# Patient Record
Sex: Male | Born: 1985 | Race: White | Hispanic: No | Marital: Single | Smoking: Current every day smoker
Health system: Southern US, Community
[De-identification: ages and names within clinical notes are randomized; demographics above are authoritative.]

## PROBLEM LIST (undated history)

## (undated) DIAGNOSIS — J45909 Unspecified asthma, uncomplicated: Secondary | ICD-10-CM

## (undated) DIAGNOSIS — E079 Disorder of thyroid, unspecified: Secondary | ICD-10-CM

## (undated) HISTORY — PX: THYROIDECTOMY: SHX17

---

## 2010-02-06 ENCOUNTER — Emergency Department (HOSPITAL_COMMUNITY): Admission: EM | Admit: 2010-02-06 | Discharge: 2010-02-07 | Payer: Self-pay | Admitting: Emergency Medicine

## 2010-12-07 ENCOUNTER — Emergency Department (HOSPITAL_COMMUNITY)
Admission: EM | Admit: 2010-12-07 | Discharge: 2010-12-07 | Disposition: A | Discharge status: Home / Self Care | Payer: Self-pay | Attending: Emergency Medicine | Admitting: Emergency Medicine

## 2010-12-07 DIAGNOSIS — IMO0002 Reserved for concepts with insufficient information to code with codable children: Secondary | ICD-10-CM | POA: Insufficient documentation

## 2010-12-07 DIAGNOSIS — J45909 Unspecified asthma, uncomplicated: Secondary | ICD-10-CM | POA: Insufficient documentation

## 2010-12-07 DIAGNOSIS — W540XXA Bitten by dog, initial encounter: Secondary | ICD-10-CM | POA: Insufficient documentation

## 2011-01-04 LAB — RAPID URINE DRUG SCREEN, HOSP PERFORMED
Cocaine: NOT DETECTED
Opiates: NOT DETECTED

## 2011-01-04 LAB — COMPREHENSIVE METABOLIC PANEL
ALT: 23 U/L (ref 0–53)
AST: 26 U/L (ref 0–37)
Albumin: 4.6 g/dL (ref 3.5–5.2)
Alkaline Phosphatase: 65 U/L (ref 39–117)
CO2: 29 mEq/L (ref 19–32)
GFR calc Af Amer: >60 mL/min (ref >60)
Total Bilirubin: 0.3 mg/dL (ref 0.3–1.2)
Total Protein: 7.7 g/dL (ref 6.0–8.3)

## 2011-01-04 LAB — POCT CARDIAC MARKERS: CKMB, poc: 1.1 ng/mL (ref 1.0–8.0)

## 2011-01-04 LAB — CBC
MCHC: 34.2 g/dL (ref 30.0–36.0)
Platelets: 204 10*3/uL (ref 150–400)
RBC: 4.48 MIL/uL (ref 4.22–5.81)
WBC: 9.6 10*3/uL (ref 4.0–10.5)

## 2011-01-04 LAB — DIFFERENTIAL
Basophils Absolute: 0.1 10*3/uL (ref 0.0–0.1)
Basophils Relative: 1 % (ref 0–1)
Eosinophils Absolute: 0.3 10*3/uL (ref 0.0–0.7)
Eosinophils Relative: 3 % (ref 0–5)
Lymphocytes Relative: 19 % (ref 12–46)
Monocytes Absolute: 1.4 10*3/uL — ABNORMAL HIGH (ref 0.1–1.0)
Monocytes Relative: 14 % — ABNORMAL HIGH (ref 3–12)

## 2011-09-20 IMAGING — CR DG CHEST 2V
2 series · 2 of 2 positions shown · non-contrast
Comparison: None.

CLINICAL DATA: Rapid heart rate, shortness of breath

CHEST - 2 VIEW

[w chest pa]
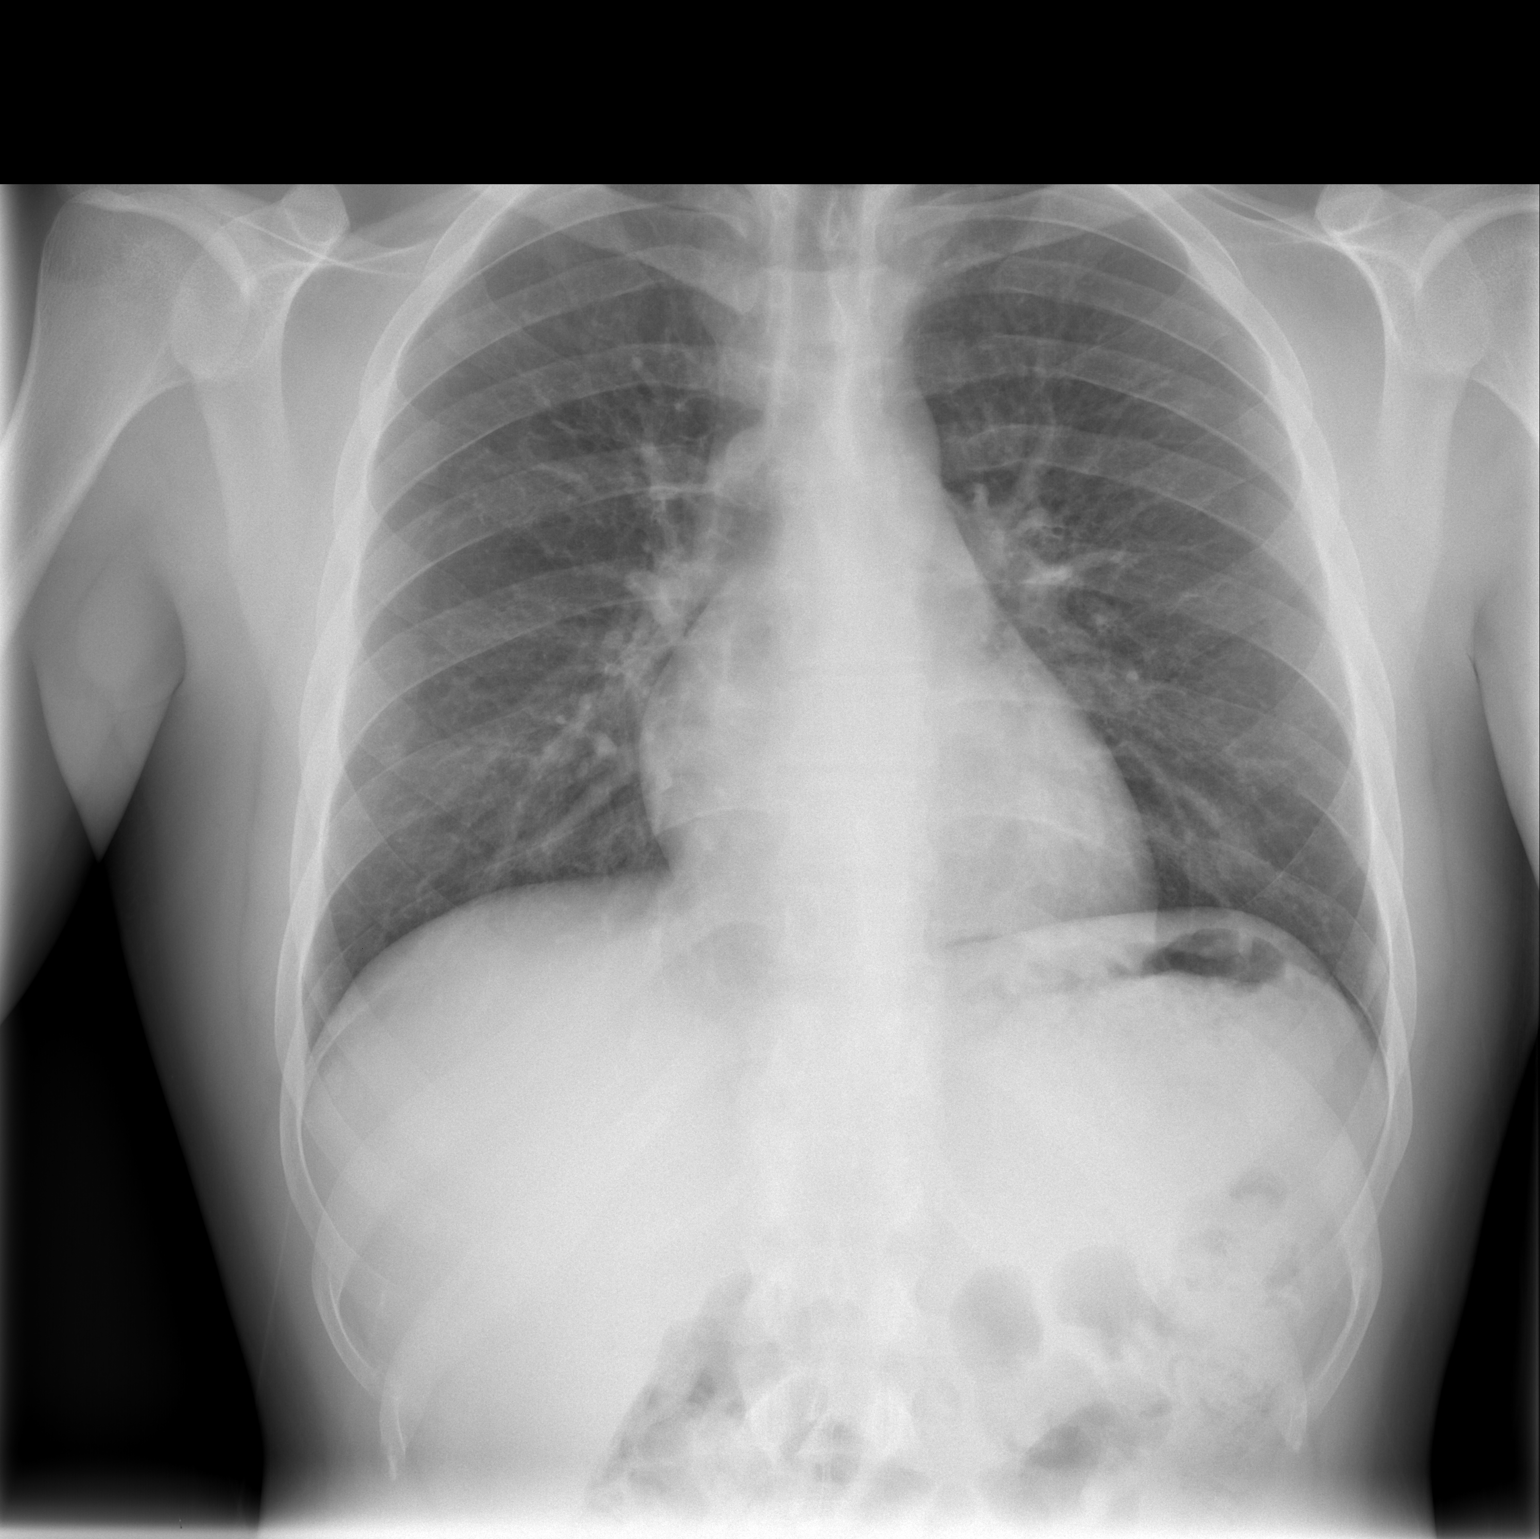

[w chest lat]
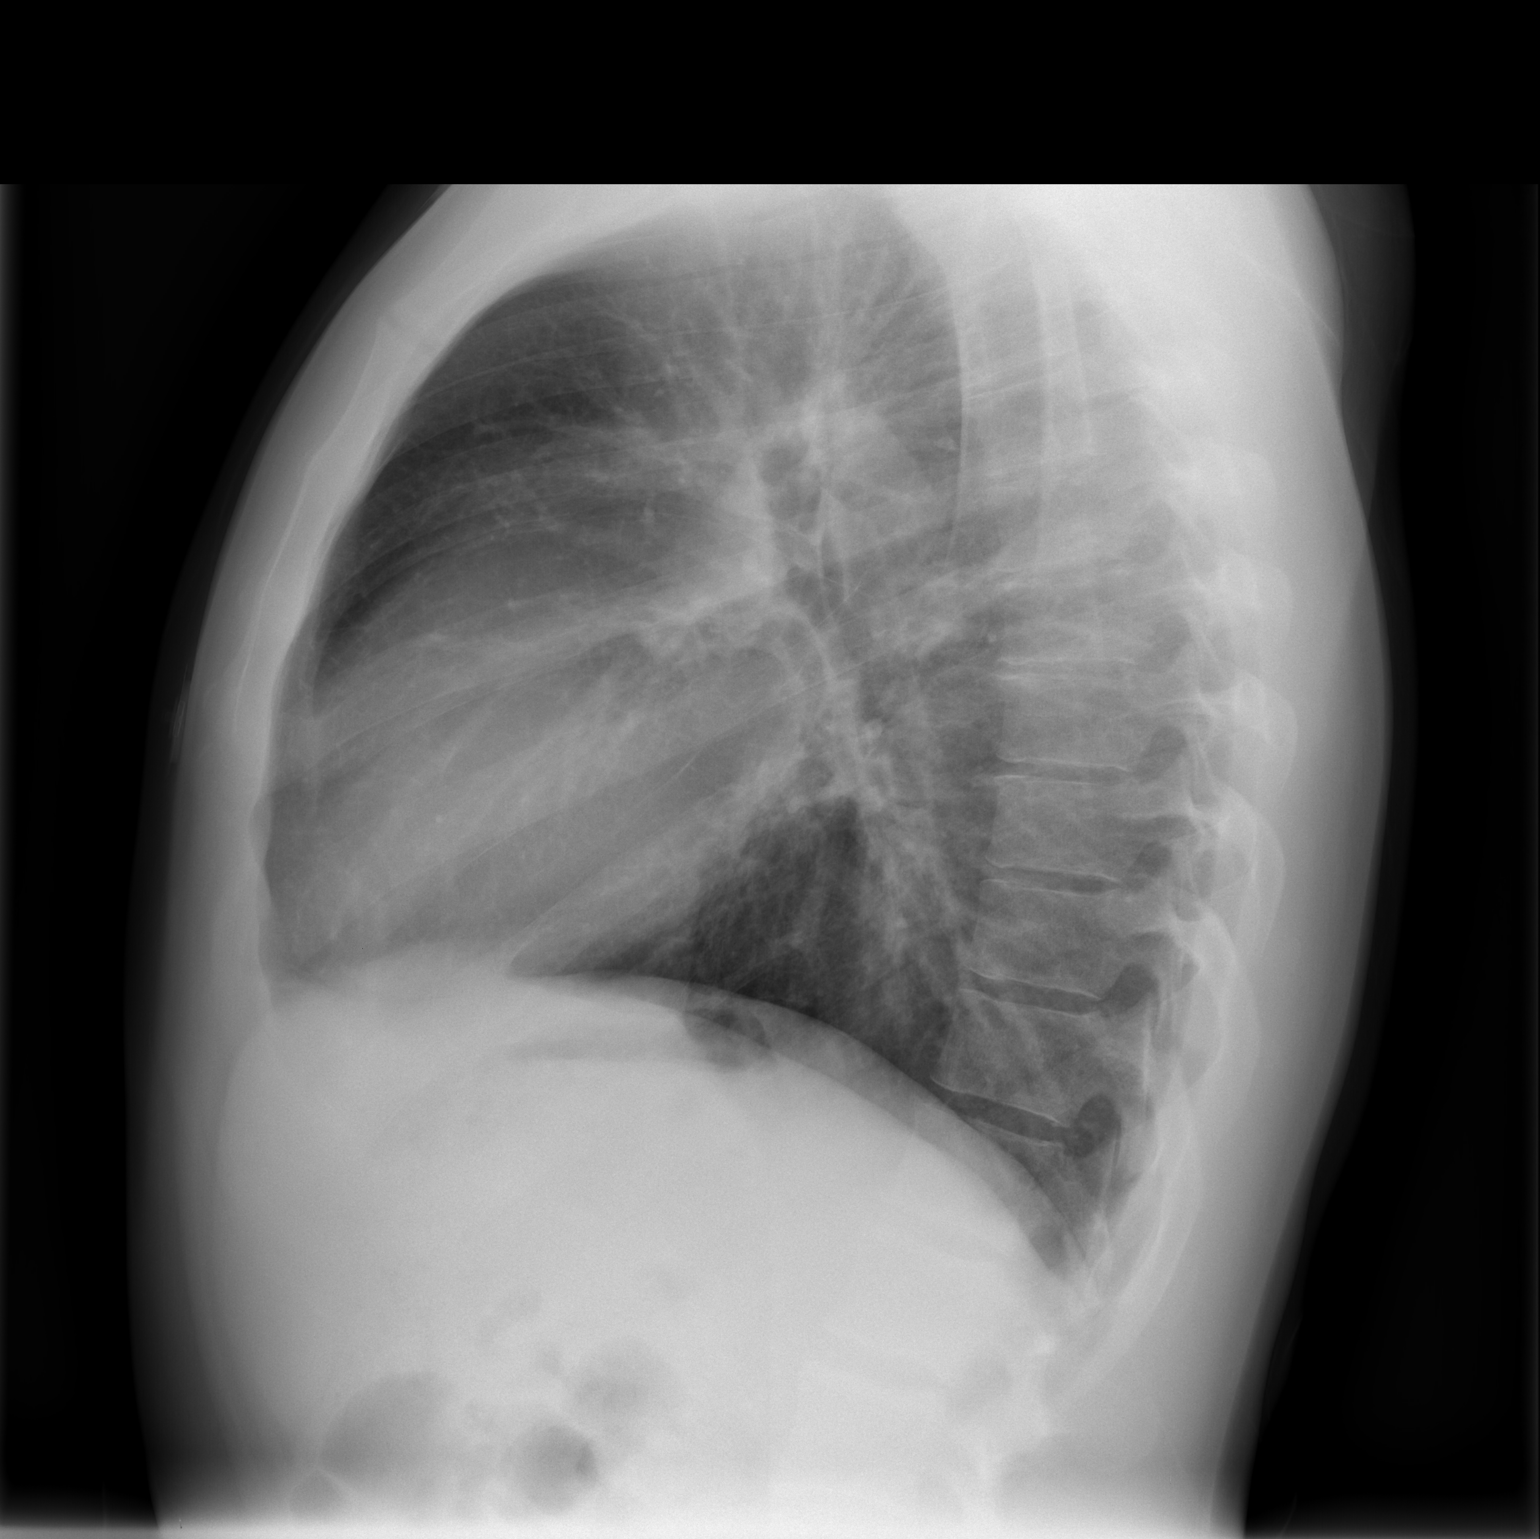

[2 of 2 positions shown; findings below may reference images not displayed]

FINDINGS: No active infiltrate or effusion is seen.  The lungs are
hyperaerated and there is some peribronchial thickening, findings
which can be seen with asthma or bronchitis.  The heart is within
normal limits in size.  No bony abnormality is seen.
IMPRESSION: No pneumonia.  Hyperaeration and peribronchial thickening may
indicate asthma or bronchitis.

## 2016-01-08 ENCOUNTER — Emergency Department (HOSPITAL_COMMUNITY)
Admission: EM | Admit: 2016-01-08 | Discharge: 2016-01-08 | Disposition: A | Discharge status: Home / Self Care | Payer: Self-pay | Attending: Emergency Medicine | Admitting: Emergency Medicine

## 2016-01-08 ENCOUNTER — Encounter (HOSPITAL_COMMUNITY): Payer: Self-pay | Admitting: Emergency Medicine

## 2016-01-08 DIAGNOSIS — Z79899 Other long term (current) drug therapy: Secondary | ICD-10-CM | POA: Insufficient documentation

## 2016-01-08 DIAGNOSIS — F329 Major depressive disorder, single episode, unspecified: Secondary | ICD-10-CM | POA: Insufficient documentation

## 2016-01-08 DIAGNOSIS — F32A Depression, unspecified: Secondary | ICD-10-CM

## 2016-01-08 NOTE — ED Notes (Signed)
Pt reports depression. Denies SI/HI or substance abuse, although he did tell someone he thought about hurting himself without meaning it. Depression has been worsened by losing job and having to live at home.

## 2016-01-08 NOTE — ED Provider Notes (Signed)
CSN: [id number]     Arrival date & time 01/08/16  1404 History   First MD Initiated Contact with Patient 01/08/16 1507     Chief Complaint  Patient presents with  . Depression     (Consider location/radiation/quality/duration/timing/severity/associated sxs/prior Treatment) HPI Comments: Patient here complaining of increased depression times several days. Denies any suicidal or homicidal ideations. No history of substance abuse. His issues are related to problems at home. Denies any prior history of depression. No prior history of suicide attempt. Does not have a primary care doctor and did not know where to go for help which is why he came to the emergency department. Denies any somatic complaints at this time. No self Medication used  Patient is a 30 y.o. male presenting with depression. The history is provided by the patient.  Depression    History reviewed. No pertinent past medical history. History reviewed. No pertinent past surgical history. History reviewed. No pertinent family history. Social History  Substance Use Topics  . Smoking status: Never Smoker   . Smokeless tobacco: None  . Alcohol Use: Yes     Comment: occasional    Review of Systems  Psychiatric/Behavioral: Positive for depression.  All other systems reviewed and are negative.     Allergies  Review of patient's allergies indicates no known allergies.  Home Medications   Prior to Admission medications   Medication Sig Start Date End Date Taking? Authorizing Provider  cetirizine (ZYRTEC) 10 MG tablet Take 10 mg by mouth daily.   Yes Historical Provider, MD   BP 133/95 mmHg  Pulse 99  Temp(Src) 98 F (36.7 C) (Oral)  Resp 16  SpO2 99% Physical Exam  Constitutional: He is oriented to person, place, and time. He appears well-developed and well-nourished.  Non-toxic appearance. No distress.  HENT:  Head: Normocephalic and atraumatic.  Eyes: Conjunctivae, EOM and lids are normal. Pupils are equal,  round, and reactive to light.  Neck: Normal range of motion. Neck supple. No tracheal deviation present. No thyroid mass present.  Cardiovascular: Normal rate, regular rhythm and normal heart sounds.  Exam reveals no gallop.   No murmur heard. Pulmonary/Chest: Effort normal and breath sounds normal. No stridor. No respiratory distress. He has no decreased breath sounds. He has no wheezes. He has no rhonchi. He has no rales.  Abdominal: Soft. Normal appearance and bowel sounds are normal. He exhibits no distension. There is no tenderness. There is no rebound and no CVA tenderness.  Musculoskeletal: Normal range of motion. He exhibits no edema or tenderness.  Neurological: He is alert and oriented to person, place, and time. He has normal strength. No cranial nerve deficit or sensory deficit. GCS eye subscore is 4. GCS verbal subscore is 5. GCS motor subscore is 6.  Skin: Skin is warm and dry. No abrasion and no rash noted.  Psychiatric: His speech is normal and behavior is normal. His affect is blunt. He expresses no suicidal plans and no homicidal plans.  Nursing note and vitals reviewed.   ED Course  Procedures (including critical care time) Labs Review Labs Reviewed - No data to display  Imaging Review No results found. I have personally reviewed and evaluated these images and lab results as part of my medical decision-making.   EKG Interpretation None      MDM   Final diagnoses:  Depression    Patient given outpatient resources and has no current evidence of active psychiatric emergency.    Lorre NickAnthony Treysean Petruzzi, MD 01/08/16 [phone]

## 2016-01-08 NOTE — Discharge Instructions (Signed)
Community Resource Guide Outpatient Counseling/Substance Abuse Adult °The [employer]’s “211” is a great source of information about community services available.  Access by dialing 2-1-1 from anywhere in [place], or by website -  www.nc211.org.  ° °Other Local Resources (Updated 10/2015) ° °Crisis Hotlines °  °Services  ° °  °Area Served  °[facility] • Crisis Hotline, available 24 hours a day, 7 days a week: 800-939-5911 [place], [place]  ° [facility] • Crisis Hotline, available 24 hours a day, 7 days a week: 866-275-9552 [place], [place]  °[facility] • Suicide Prevention Hotline, available 24 hours a day, 7 days a week: 800-273-8255 [place], [place]  °[facility] ° • Crisis Hotline, available 24 hours a day, 7 days a week: 336-676-6840 [place], [place] °  °[facility] • Crisis Hotline, available 24 hours a day, 7 days a week: 800-256-2452 All °  °[facility] • Crisis Hotline, available 24 hours a day, 7 days a week: 877-626-1772 [place]  ° °Other Local Resources (Updated 10/2015) ° °Outpatient Counseling/ Substance Abuse Programs  °Services  ° °  °Address and Phone Number  °[facility] ° • Options include Individual counseling, group counseling, intensive outpatient program (several hours a day, several days a week) °• Offers depression assessments °• Provides methadone maintenance program 336-333-6860 °[address] °[place], [place] [address] °  °[facility] ° • Offers partial hospitalization/day treatment and DUI/DWI programs °• Accepts Medicare, private insurance 336-299-4655 °[address] °[place], [place] [address]  °[facility] ° ° • Services include intensive outpatient program (several hours a day, several days a week), outpatient treatment, DUI/DWI services, family education °• Also has some services specifically for Veterans °• Offers transitional housing   336-886-5594 °[address] °[place], [place] [address] °  °  °[facility] • Accepts Medicare, private pay, and private insurance 336-272-0855 °[address] °[place], [place] [address]  °[facility] • Services include individual counseling, substance abuse intensive outpatient program (several hours a day, several days a week), day treatment °• Accepts Medicare, Medicaid, private insurance 336-271-5888 °[address] °[place], [place] [address]  °[facility] ° • Offers substance abuse intensive outpatient program (several hours a day, several days a week), partial hospitalization program 336-832-9800 °[address] °[place], [place] [address] ° °336-349-4454 °[address] °[place], [place] [address] ° °336-386-3795 °[address] °[place], [place] [address] ° °[phone] °[address] °[place], [place] [address]  °[facility] • Individual counseling only °• Accepts private insurance only 336-292-1510 °[address] °[place], [place] [address]  °[facility] • Methadone maintenance program 800-805-6989 °[address] °[place], [place] [address]  °[facility] • Walk-In Clinic providing substance abuse and mental health counseling °• Accepts Medicaid, Medicare, private insurance °• Offers sliding scale for uninsured 336-342-8316 °[address] °[place], [place]   °[facility] • Offers individual counseling, and intensive in-home services 336-347-7415 °[address] °[place], [place] [address]  °[facility] • Offers individual counseling, family counseling, group therapy, domestic violence counseling, consumer credit counseling °• Accepts Medicare, Medicaid, private insurance °• Offers sliding scale for uninsured 336-387-6161 °[address] °[place], [place] [address] ° °336-889-6161 °[place], [address] °[place],  [address]  °[facility] • Offers individual, family   and group counseling °• 3 locations - [place], [place], and [place] ° 336-899-8800 ° °[address] °[place], [place] [address] ° °[address] °[place], [place] [address] ° °[address] °[place], [place] [address]  °[facility]  ° • Offers psychiatric assessment, 8-week Intensive Outpatient Program (several hours a day, several times a week, daytime or evenings), early recovery group, family Program, medication management °• Private pay or private insurance only 336 -621-3381, or  °800-659-3381 °[address] °[place], [place] [address]  °[facility] • Offers individual, couples and family counseling °• Accepts Medicaid, private insurance, and sliding scale for uninsured 336-542-2076 °[address] °[place], [place] [address]  °David Fuller, [facility] • Individual counseling °• Private insurance 336-852-4051 °[address] °[place], [place] [address]  °[facility] ° • Offers assessment, substance abuse treatment, and behavioral health treatment 336-878-6098 °[address] °[place], [place] [address]  °[facility] • Individual counseling °• Accepts private insurance 336-272-1972 °[address] °[place], [place] [address]  °[facility] • Individual counseling °• Accepts Medicare, private insurance 336-547-1574 °[address] °[place], [place] [address]  °[facility]  ° • Offers intensive outpatient program (several hours a day, several times a week) °• Private pay, private insurance [phone] °[address] °[place], [place]  °[facility] • Individual counseling °• Medicare, private insurance 336-505-9494 °[address] °[place], [place] [address]  °[facility]  ° • Offers intensive outpatient program (several hours a day, several times a week) and partial hospitalization  program 336-794-3550 °[address] °[place], [place] [address]  °Parrish McKinney, MD • Individual counseling 336-282-1251 °[address] °[place], [place] [address]  °[facility] • Offers Christian counseling to individuals, couples, and families °• Accepts Medicare and private insurance; offers sliding scale for uninsured 336-288-1484 °[address] °[place], [place] [address]  °[facility] • Christian counseling 336-542-2060 °[address] °[place], [place] [address]  °[facility] ° • Offers crisis counseling, individual counseling, group therapy, in-home therapy, domestic violence services, day treatment, DWI services, Community Support Team (CST), Assertive Community Treatment Team (ACTT), substance abuse Intensive Outpatient Program (several hours a day, several times a week) °• 2 locations - [place] and [place] 336-229-5905 °[address] °[place], [place] [address] ° °336-694-1777 °[address] °[place], [place] [address]  °[facility]  ° ° • Individual counseling and group therapy °• Accepts private insurance, Medicare, Medicaid 336-379-7146 °[address] °[place], [place]  °[facility] • Offers individual and family counseling °• Offers LGBTQ services °• Accepts private insurance and private pay 336-288-9190 °[address] °[place], [place] [address]  °[facility]  ° • Offers individual counseling, group therapy, and outpatient detox °• Accepts private insurance 336-389-1413 °[address] °[place], [place]  °[facility] • Individual counseling °• Accepts Medicare, private insurance 336-632-3505 °[address] °[place], [place] [address]  °[facility] • Individual counseling °• Accepts Medicare, private insurance 336-570-0104 °[address] °[place],  [address]  °[facility] ° • Offers substance abuse  Intensive Outpatient Program (several hours a day, several times a week) [phone], or °[phone] °,   ° °

## 2016-04-14 ENCOUNTER — Encounter (HOSPITAL_COMMUNITY): Payer: Self-pay | Admitting: *Deleted

## 2016-04-14 ENCOUNTER — Emergency Department (HOSPITAL_COMMUNITY)
Admission: EM | Admit: 2016-04-14 | Discharge: 2016-04-14 | Disposition: A | Discharge status: Home / Self Care | Payer: Self-pay | Attending: Emergency Medicine | Admitting: Emergency Medicine

## 2016-04-14 DIAGNOSIS — H168 Other keratitis: Secondary | ICD-10-CM | POA: Insufficient documentation

## 2016-04-14 DIAGNOSIS — H16133 Photokeratitis, bilateral: Secondary | ICD-10-CM

## 2016-04-14 MED ORDER — TETRACAINE HCL 0.5 % OP SOLN
2.0000 [drp] | Freq: Once | OPHTHALMIC | Status: AC
  Administered 2016-04-14: 2 [drp] via OPHTHALMIC
  Filled 2016-04-14: qty 2

## 2016-04-14 MED ORDER — FLUORESCEIN SODIUM 1 MG OP STRP
1.0000 | ORAL_STRIP | Freq: Once | OPHTHALMIC | Status: AC
  Administered 2016-04-14: 1 via OPHTHALMIC
  Filled 2016-04-14: qty 1

## 2016-04-14 MED ORDER — OXYCODONE-ACETAMINOPHEN 5-325 MG PO TABS: 1.0000 | ORAL_TABLET | ORAL | Status: DC | PRN

## 2016-04-14 MED ORDER — OXYCODONE-ACETAMINOPHEN 5-325 MG PO TABS
1.0000 | ORAL_TABLET | Freq: Once | ORAL | Status: AC
  Administered 2016-04-14: 1 via ORAL
  Filled 2016-04-14: qty 1

## 2016-04-14 MED ORDER — ERYTHROMYCIN 5 MG/GM OP OINT: TOPICAL_OINTMENT | OPHTHALMIC | Status: DC

## 2016-04-14 NOTE — ED Provider Notes (Signed)
CSN: [id number]     Arrival date & time 04/14/16  0033 History  By signing my name below, I, Phillis HaggisGabriella Gaje, attest that this documentation has been prepared under the direction and in the presence of Dione Boozeavid Lorrane Mccay, MD. Electronically Signed: Phillis HaggisGabriella Gaje, ED Scribe. 04/14/2016. 3:20 AM.   Chief Complaint  Patient presents with  . Eye Pain   The history is provided by the patient. No language interpreter was used.  HPI Comments: Mark Grant is a 30 y.o. male who presents to the Emergency Department complaining of gradually worsening bilateral eye pain onset one day ago. 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 He reports associated clear drainage. He reports mild relief after using eyedrops but states that the pain came back and is now in the left eye. He has tried to rinse out his eyes in the shower to no relief. Pt states that he also worked around a lot of smoke and chemicals and believes this may be causing the irritation. He denies that he took his welding shield off at any point. He has both eyes covered in the ED. He denies physical trauma to the eyes.  History reviewed. No pertinent past medical history. History reviewed. No pertinent past surgical history. No family history on file. Social History  Substance Use Topics  . Smoking status: Never Smoker   . Smokeless tobacco: None  . Alcohol Use: Yes     Comment: occasional    Review of Systems  Eyes: Positive for pain, discharge and redness.  All other systems reviewed and are negative.  Allergies  Review of patient's allergies indicates no known allergies.  Home Medications   Prior to Admission medications   Not on File   BP 120/79 mmHg  Pulse 72  Temp(Src) 98.4 F (36.9 C) (Oral)  Resp 19  Ht 5\' 10"  (1.778 m)  Wt 185 lb (83.915 kg)  BMI 26.54 kg/m2  SpO2 98% Physical Exam  Constitutional: He is oriented to person, place, and time. He appears  well-developed and well-nourished.  Appears uncomfortable  HENT:  Head: Normocephalic and atraumatic.  Eyes: EOM are normal. Pupils are equal, round, and reactive to light. Right conjunctiva is injected. Left conjunctiva is injected.  Neck: Normal range of motion. Neck supple. No JVD present.  Cardiovascular: Normal rate, regular rhythm and normal heart sounds.  Exam reveals no gallop and no friction rub.   No murmur heard. Pulmonary/Chest: Effort normal and breath sounds normal. He has no wheezes. He has no rales. He exhibits no tenderness.  Abdominal: Soft. Bowel sounds are normal. He exhibits no distension and no mass. There is no tenderness.  Musculoskeletal: Normal range of motion. He exhibits no edema.  Lymphadenopathy:    He has no cervical adenopathy.  Neurological: He is alert and oriented to person, place, and time. No cranial nerve deficit. He exhibits normal muscle tone. Coordination normal.  Skin: Skin is warm and dry. No rash noted.  Psychiatric: He has a normal mood and affect. His behavior is normal. Judgment and thought content normal.  Nursing note and vitals reviewed.   ED Course  Procedures (including critical care time) DIAGNOSTIC STUDIES: Oxygen Saturation is 98% on RA, normal by my interpretation.    COORDINATION OF CARE: 3:19 AM-Discussed treatment plan which includes eye exam with pt at bedside and pt agreed to plan.   His eyes were anesthetized with topical tetracaine. Following this,  it was still difficult to get a good eye exam. Eyes were stained with flow seen and examined with Wood's lamp showing punctate areas of increased uptake consistent with UV keratitis.  MDM   Final diagnoses:  UV keratitis, bilateral    UV keratitis of both eyes. He is discharged with prescriptions for erythromycin ophthalmic ointment, and oxycodone-acetaminophen. Referred to ophthalmology for follow-up if not improving over the next 2 days.  I personally performed the  services described in this documentation, which was scribed in my presence. The recorded information has been reviewed and is accurate.       Dione Boozeavid Alida Greiner, MD 04/14/16 [phone]

## 2016-04-14 NOTE — ED Notes (Signed)
The pt has been around welding and other chemicals  Pain etc.  Rt eye started burning first followed by his lt eye  Now he reports that he cannot open his eyes without pain
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
## 2019-04-10 ENCOUNTER — Other Ambulatory Visit: Payer: Self-pay

## 2019-04-10 ENCOUNTER — Encounter: Payer: Self-pay | Admitting: Internal Medicine

## 2019-04-10 ENCOUNTER — Ambulatory Visit: Payer: Self-pay | Admitting: Internal Medicine

## 2019-04-10 VITALS — BP 130/84 | HR 97 | Temp 98.2°F | Ht 70.0 in | Wt 187.0 lb

## 2019-04-10 DIAGNOSIS — Z79899 Other long term (current) drug therapy: Secondary | ICD-10-CM

## 2019-04-10 DIAGNOSIS — J4520 Mild intermittent asthma, uncomplicated: Secondary | ICD-10-CM

## 2019-04-10 DIAGNOSIS — J45909 Unspecified asthma, uncomplicated: Secondary | ICD-10-CM | POA: Insufficient documentation

## 2019-04-10 DIAGNOSIS — E049 Nontoxic goiter, unspecified: Secondary | ICD-10-CM | POA: Insufficient documentation

## 2019-04-10 NOTE — Progress Notes (Signed)
   CC: swollen neck   HPI:  Mark Grant is a 33 y.o. M with no PMHx who presents for evaluation of swollen neck. He states that over the past couple weeks he's noticed a fullness in his neck. When he stretches he will feel sob, dizzy, and light headed. Snores a lot and doesn't feel well rested. He has started sleeping on extra pillows which has helped. Feels like it has grown in the last couple weeks and is slightly tender. No distinct nodules.    He also notes a 10lb weight gain over the last 3 months, increased fatigued, chronic intermittent palpitations and chronic heat intolerance.  He has a strong family history of thyroid disease:  Great grandmother with thyroid cancer Goiter with hyper and hypothyroidism in his mom, maternal aunt, maternal grandmother   He denies syncope, chest pain, dry skin, hair loss, constipation, diarrhea.    No past medical history on file.  Physical Exam:  There were no vitals filed for this visit. Gen: well appearing male, NAD HENT: symmetric fullness in the anterior neck consistent with thyroid goiter. Slightly TTP diffusely. No distinct nodules appreciated. Oropharynx without obstruction.  Cardiac: RRR, no m/r/g Pulm: CTAB, normal wob, able to speak full sentences without difficulty   Assessment & Plan:   See Encounters Tab for problem based charting.  Patient discussed with Dr. Lynnae January

## 2019-04-10 NOTE — Assessment & Plan Note (Addendum)
Subacute. Notes increased neck fullness over the last couple weeks with compressive symptoms (when he stretches his neck he feels dizzy, lightheaded, and sob). Also notes increased snoring and restless sleep which could be due to OSA 2/2 thyroid goiter. Strong FHx of thryoid goiter with hyper and hypothyroidism. Endorses some symptoms of hypothyroidism (weigth gain, fatigue). Exam is consistent with diffuse goiter, slightly TTP without discrete nodules. Will need to evaluate the functional status of the goiter and then pursue diagnostic imaging and further workup depending on his TSH and free T4.   - TSH, Free T4 - He also notes a family history of anemia so we will add on a CBC - Patient advised to go the ED if he experiences worsening sob or syncope   ADDENDUM: TSH elevated, free t4 low. 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 Could consider checking TPO antibodies but will defer until patient obtains [employer]. Discussed results and plan with patient. Will need to start thyroid hormone replacement and recheck TSH in 4-6 weeks. Given compressive symptoms, also need to obtain CT scan of the neck +/- the chest in order to evaluate the full extent of his thyroid goiter. Hopefully he will obtain the orange card within the next few weeks and can then get the CT scan done soon after.  - Rx Levothyroxine 186mcg - Repeat TSH in 4 weeks and increase daily levothyroxine dose by 12-81mcg if needed.  - Financial counselor appt 7/8, stressed the importance of patient obtaining the [employer] as quickly as possible - will need CT neck w/o contrast (and maybe also CT chest w/o contrast) once orange card is obtained or sooner if compressive symptoms worsen. I have placed future orders for these imaging studies. - After CT, he will need referral to ENT for possible thyroidectomy

## 2019-04-10 NOTE — Assessment & Plan Note (Signed)
Chronic. Well controlled. Diagnosed as a child. No formal PFTs on file. He has an old albuterol inhaler at home but the last time he needed to use it was 8 months ago. He states that his asthma tends to bother him most during season changes. No wheezing on exam.   - continue asthma inhaler prn

## 2019-04-10 NOTE — Patient Instructions (Signed)
It was nice seeing you today. Thank you for choosing [facility] for your Primary Care.   I think you do have a thryoid goiter. We will start with some blood work to see if the goiter is functioning. Depending on those results, we will decide on what kind of imaging study to order. I will call you tomorrow to discuss your results.   If you notice worsening shortness of breath or pass out, please go to the emergency room.    FOLLOW-UP INSTRUCTIONS When: I call will you  For: thyroid goiter   Please contact the clinic if you have any problems, or need to be seen sooner.    Goiter  A goiter is an enlarged thyroid gland. The thyroid is located in the lower front of the neck. It makes hormones that affect many body parts and systems, including the system that affects how quickly the body burns fuel for energy (metabolism). Most goiters are painless and are not a cause for concern. Some goiters can affect the way your thyroid makes thyroid hormones. Goiters and conditions that cause goiters can be treated, if necessary. What are the causes? Common causes of this condition include:  Lack (deficiency) of a mineral called iodine. The thyroid gland uses iodine to make thyroid hormones.  Diseases that attack healthy cells in the body (autoimmune diseases) and affect thyroid function, such as Graves' disease or Hashimoto's disease. These diseases may cause the body to produce too much thyroid hormone (hyperthyroidism) or too little of the hormone (hypothyroidism).  Conditions that cause inflammation of the thyroid (thyroiditis).  One or more small growths on the thyroid (nodular goiter). Other causes include:  Medical problems caused by abnormal genes that are passed from parent to child (genetic defects).  Thyroid injury or infection.  Tumors that may or may not be cancerous.  Pregnancy.  Certain medicines.  Exposure to radiation. In some cases, the cause may not be known.  What increases the risk? This condition is more likely to develop in:  People who do not get enough iodine in their diet.  People who have a family history of goiter.  Women.  People who are older than age 34.  People who smoke tobacco.  People who have had exposure to radiation. What are the signs or symptoms? The main symptom of this condition is swelling in the lower, front part of the neck. This swelling can range from a very small bump to a large lump. Other symptoms may include:  A tight feeling in the throat.  A hoarse voice.  Coughing.  Wheezing.  Difficulty swallowing or breathing.  Bulging veins in the neck.  Dizziness. When a goiter is the result of an overactive thyroid (hyperthyroidism), symptoms may also include:  Nervousness or restlessness.  Inability to tolerate heat.  Unexplained weight loss.  Diarrhea.  Change in the texture of hair or skin.  Changes in heartbeat, such as skipped beats, extra beats, or a rapid heart rate.  Loss of menstruation.  Shaky hands.  Increased appetite.  Sleep problems. When a goiter is the result of an underactive thyroid (hypothyroidism), symptoms may also include:  Feeling like you have no energy (lethargy).  Inability to tolerate cold.  Weight gain that is not explained by a change in diet or exercise habits.  Dry skin.  Coarse hair.  Irregular menstrual periods.  Constipation.  Sadness or depression.  Fatigue. In some cases, there may not be any symptoms and the thyroid hormone levels may  be normal. How is this diagnosed? This condition may be diagnosed based on your symptoms, your medical history, and a physical exam. You may have tests, such as:  Blood tests to check thyroid function.  Imaging tests, such as: ? Ultrasound. ? CT scan. ? MRI. ? Thyroid scan.  Removal of a tissue sample (biopsy) of the goiter or any nodules. The sample will be tested to check for cancer. How is this  treated? Treatment for this condition depends on the cause and your symptoms. Treatment may include:  Medicines to regulate thyroid hormone levels.  Anti-inflammatory medicines or steroid medicines, if the goiter is caused by inflammation.  Iodine supplements or changes to your diet, if the goiter is caused by iodine deficiency.  Radioactive iodine treatment.  Surgery to remove your thyroid. In some cases, you may only need regular check-ups with your health care provider to monitor your condition, and you may not need treatment. Follow these instructions at home:  Follow instructions from your health care provider about any changes to your diet.  Take over-the-counter and prescription medicines only as told by your health care provider. These include supplements.  Do not use any products that contain nicotine or tobacco, such as cigarettes and e-cigarettes. If you need help quitting, ask your health care provider.  Keep all follow-up visits as told by your health care provider. This is important. Contact a health care provider if:  Your symptoms do not get better with treatment.  You have nausea, vomiting, or diarrhea. Get help right away if:  You have sudden, unexplained confusion or other mental changes.  You have a fever.  You have chest pain.  You have trouble breathing or swallowing.  You suddenly become very weak.  You experience extreme restlessness.  You feel your heart racing. Summary  A goiter is an enlarged thyroid gland.  The thyroid gland is located in the lower front of the neck. It makes hormones that affect many body parts and systems, including the system that affects how quickly the body burns fuel for energy (metabolism).  The main symptom of this condition is swelling in the lower, front part of the neck. This swelling can range from a very small bump to a large lump.  Treatment for this condition depends on the cause and your symptoms. You may  need medicines, supplements, or regular monitoring of your condition. This information is not intended to replace advice given to you by your health care provider. Make sure you discuss any questions you have with your health care provider. Document Released: 03/23/2010 Document Revised: 06/29/2017 Document Reviewed: 06/29/2017 Elsevier Interactive Patient Education  [employer].

## 2019-04-11 ENCOUNTER — Other Ambulatory Visit: Payer: Self-pay | Admitting: Internal Medicine

## 2019-04-11 DIAGNOSIS — E049 Nontoxic goiter, unspecified: Secondary | ICD-10-CM

## 2019-04-11 LAB — CBC
Hematocrit: 37.2 % — ABNORMAL LOW (ref 37.5–51.0)
Hemoglobin: 13 g/dL (ref 13.0–17.7)
MCH: 34.1 pg — ABNORMAL HIGH (ref 26.6–33.0)
MCHC: 34.9 g/dL (ref 31.5–35.7)
MCV: 98 fL — ABNORMAL HIGH (ref 79–97)
Platelets: 193 10*3/uL (ref 150–450)
RBC: 3.81 x10E6/uL — ABNORMAL LOW (ref 4.14–5.80)
RDW: 13.2 % (ref 11.6–15.4)
WBC: 4.7 10*3/uL (ref 3.4–10.8)

## 2019-04-11 LAB — T4, FREE: Free T4: 0.66 ng/dL — ABNORMAL LOW (ref 0.82–1.77)

## 2019-04-11 LAB — TSH: TSH: 29.72 u[IU]/mL — ABNORMAL HIGH (ref 0.450–4.500)

## 2019-04-11 MED ORDER — LEVOTHYROXINE SODIUM 125 MCG PO TABS: 125.0000 ug | ORAL_TABLET | Freq: Every day | ORAL | 0 refills | Status: AC

## 2019-04-22 NOTE — Progress Notes (Signed)
[facility] Medicine Clinic Attending  Case discussed with Dr. Vogel  at the time of the visit.  We reviewed the resident's history and exam and pertinent patient test results.  I agree with the assessment, diagnosis, and plan of care documented in the resident's note.  

## 2019-04-24 ENCOUNTER — Other Ambulatory Visit: Payer: Self-pay

## 2019-04-24 ENCOUNTER — Ambulatory Visit: Payer: Self-pay

## 2019-04-29 ENCOUNTER — Encounter: Payer: Self-pay | Admitting: Internal Medicine

## 2019-05-21 ENCOUNTER — Ambulatory Visit (HOSPITAL_COMMUNITY): Payer: Self-pay

## 2019-05-21 ENCOUNTER — Ambulatory Visit (HOSPITAL_COMMUNITY): Admission: RE | Admit: 2019-05-21 | Payer: Self-pay | Source: Ambulatory Visit

## 2019-09-18 ENCOUNTER — Encounter: Payer: Self-pay | Admitting: Internal Medicine

## 2019-09-18 ENCOUNTER — Ambulatory Visit: Payer: Self-pay

## 2021-11-04 ENCOUNTER — Emergency Department (HOSPITAL_COMMUNITY)
Admission: EM | Admit: 2021-11-04 | Discharge: 2021-11-05 | Disposition: A | Discharge status: Other Acute Inpatient Hospital | Payer: Self-pay | Attending: Emergency Medicine | Admitting: Emergency Medicine

## 2021-11-04 ENCOUNTER — Encounter (HOSPITAL_COMMUNITY): Payer: Self-pay

## 2021-11-04 ENCOUNTER — Other Ambulatory Visit: Payer: Self-pay

## 2021-11-04 DIAGNOSIS — Z79899 Other long term (current) drug therapy: Secondary | ICD-10-CM | POA: Insufficient documentation

## 2021-11-04 DIAGNOSIS — Z20822 Contact with and (suspected) exposure to covid-19: Secondary | ICD-10-CM | POA: Insufficient documentation

## 2021-11-04 DIAGNOSIS — R45850 Homicidal ideations: Secondary | ICD-10-CM

## 2021-11-04 DIAGNOSIS — F339 Major depressive disorder, recurrent, unspecified: Secondary | ICD-10-CM | POA: Insufficient documentation

## 2021-11-04 DIAGNOSIS — R45851 Suicidal ideations: Secondary | ICD-10-CM | POA: Insufficient documentation

## 2021-11-04 HISTORY — DX: Unspecified asthma, uncomplicated: J45.909

## 2021-11-04 HISTORY — DX: Disorder of thyroid, unspecified: E07.9

## 2021-11-04 LAB — COMPREHENSIVE METABOLIC PANEL
ALT: 36 U/L (ref 0–44)
AST: 28 U/L (ref 15–41)
Albumin: 4.9 g/dL (ref 3.5–5.0)
Alkaline Phosphatase: 46 U/L (ref 38–126)
Anion gap: 9 (ref 5–15)
BUN: 12 mg/dL (ref 6–20)
CO2: 27 mmol/L (ref 22–32)
Calcium: 9.1 mg/dL (ref 8.9–10.3)
Chloride: 101 mmol/L (ref 98–111)
Creatinine, Ser: 1 mg/dL (ref 0.61–1.24)
GFR, Estimated: >60 mL/min (ref >60)
Glucose, Bld: 92 mg/dL (ref 70–99)
Potassium: 4.1 mmol/L (ref 3.5–5.1)
Sodium: 137 mmol/L (ref 135–145)
Total Bilirubin: 0.9 mg/dL (ref 0.3–1.2)
Total Protein: 7.9 g/dL (ref 6.5–8.1)

## 2021-11-04 LAB — CBC WITH DIFFERENTIAL/PLATELET
Abs Immature Granulocytes: 0.03 10*3/uL (ref 0.00–0.07)
Basophils Absolute: 0.1 10*3/uL (ref 0.0–0.1)
Basophils Relative: 1 %
Eosinophils Absolute: 0.1 10*3/uL (ref 0.0–0.5)
Eosinophils Relative: 1 %
HCT: 41.6 % (ref 39.0–52.0)
Hemoglobin: 14.2 g/dL (ref 13.0–17.0)
Immature Granulocytes: 1 %
Lymphocytes Relative: 18 %
Lymphs Abs: 1.2 10*3/uL (ref 0.7–4.0)
MCH: 35.4 pg — ABNORMAL HIGH (ref 26.0–34.0)
MCHC: 34.1 g/dL (ref 30.0–36.0)
MCV: 103.7 fL — ABNORMAL HIGH (ref 80.0–100.0)
Monocytes Absolute: 0.6 10*3/uL (ref 0.1–1.0)
Monocytes Relative: 9 %
Neutro Abs: 4.4 10*3/uL (ref 1.7–7.7)
Neutrophils Relative %: 70 %
Platelets: 156 10*3/uL (ref 150–400)
RBC: 4.01 MIL/uL — ABNORMAL LOW (ref 4.22–5.81)
RDW: 14.2 % (ref 11.5–15.5)
WBC: 6.3 10*3/uL (ref 4.0–10.5)
nRBC: 0 % (ref 0.0–0.2)

## 2021-11-04 LAB — RAPID URINE DRUG SCREEN, HOSP PERFORMED
Amphetamines: NOT DETECTED
Barbiturates: NOT DETECTED
Benzodiazepines: NOT DETECTED
Cocaine: NOT DETECTED
Opiates: NOT DETECTED
Tetrahydrocannabinol: POSITIVE — AB

## 2021-11-04 LAB — ETHANOL: Alcohol, Ethyl (B): <10 mg/dL (ref <10)
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
## 2021-11-04 NOTE — ED Notes (Signed)
Locked up 2 bags of belongings in the ems room on the shelves.
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
## 2021-11-04 NOTE — ED Provider Notes (Signed)
°  [facility] EMERGENCY DEPARTMENT Provider Note   CSN: [id number] Arrival date & time: 11/04/21  2152     History  Chief Complaint  Patient presents with   Homicidal    Mark Grant is a 36 y.o. male.  Pt complains of having thoughts of killing someone and then killing himself.  Pt reports his Mother and Gearldine Shown talked him in to coming in voluntarily for treatment.  Pt reports recent depression and being upset with a lot of people right now   The history is provided by the patient. No language interpreter was used.      Home Medications Prior to Admission medications   Medication Sig Start Date End Date Taking? Authorizing Provider  albuterol (VENTOLIN HFA) 108 (90 Base) MCG/ACT inhaler Inhale into the lungs every 6 (six) hours as needed.    [provider]  levothyroxine (SYNTHROID) 125 MCG tablet Take 1 tablet (125 mcg total) by mouth daily before breakfast. 04/11/19   Ali Lowe, MD      Allergies    Patient has no known allergies.    Review of Systems   Review of Systems  All other systems reviewed and are negative.  Physical Exam Updated Vital Signs BP (!) 149/110 (BP Location: Right Arm)    Pulse (!) 112    Temp 98.4 F (36.9 C) (Oral)    Resp 20    Ht 5\' 10"  (1.778 m)    Wt 79.4 kg    SpO2 100%    BMI 25.11 kg/m  Physical Exam Vitals and nursing note reviewed.  Constitutional:      Appearance: He is well-developed.  HENT:     Head: Normocephalic.  Cardiovascular:     Rate and Rhythm: Normal rate and regular rhythm.  Pulmonary:     Effort: Pulmonary effort is normal.     Breath sounds: Normal breath sounds.  Abdominal:     General: Abdomen is flat. There is no distension.  Musculoskeletal:        General: Normal range of motion.     Cervical back: Normal range of motion.  Neurological:     General: No focal deficit present.     Mental Status: He is alert and oriented to person, place, and time.  Psychiatric:        Mood and  Affect: Mood normal.    ED Results / Procedures / Treatments   Labs (all labs ordered are listed, but only abnormal results are displayed) Labs Reviewed - No data to display  EKG None  Radiology No results found.  Procedures Procedures    Medications Ordered in ED Medications - No data to display  ED Course/ Medical Decision Making/ A&P                           Medical Decision Making Amount and/or Complexity of Data Reviewed Labs: ordered.   MDM:  Medical screning labs ordered.  TTS consult after laab clearance        Final Clinical Impression(s) / ED Diagnoses Final diagnoses:  Homicidal thoughts  Suicidal thoughts    Rx / DC Orders ED Discharge Orders     None     Pt's labs pending.  T's care turned over to Dr. ,  TTS assessment pending labs    Judd Lien 11/05/21 2141    2142, MD 11/06/21 2244

## 2021-11-04 NOTE — ED Triage Notes (Signed)
Pt brought to ED and dropped off by mother. Pt says he is here because he "was getting ready to go kill some people and then kill myself."

## 2021-11-04 NOTE — ED Notes (Addendum)
Pt moved away from [facility] for a girlfriend. His girlfriend has recently broken up with him and turned his friends against him. Was accused of different things and of being violent by those friends. He now said that "I might as well live up to it and kill them and myself".  He let his mother bring him in before he acted on it. 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 Feels hopeless.

## 2021-11-05 ENCOUNTER — Inpatient Hospital Stay (HOSPITAL_COMMUNITY)
Admission: AD | Admit: 2021-11-05 | Discharge: 2021-11-10 | DRG: 885 | Disposition: A | Discharge status: Home / Self Care | Payer: Federal, State, Local not specified - Other | Source: Intra-hospital | Attending: Emergency Medicine | Admitting: Emergency Medicine

## 2021-11-05 DIAGNOSIS — F3181 Bipolar II disorder: Secondary | ICD-10-CM | POA: Diagnosis not present

## 2021-11-05 DIAGNOSIS — F319 Bipolar disorder, unspecified: Secondary | ICD-10-CM | POA: Diagnosis present

## 2021-11-05 DIAGNOSIS — F1210 Cannabis abuse, uncomplicated: Secondary | ICD-10-CM | POA: Diagnosis present

## 2021-11-05 DIAGNOSIS — Z79899 Other long term (current) drug therapy: Secondary | ICD-10-CM

## 2021-11-05 DIAGNOSIS — Z20822 Contact with and (suspected) exposure to covid-19: Secondary | ICD-10-CM | POA: Diagnosis present

## 2021-11-05 DIAGNOSIS — F332 Major depressive disorder, recurrent severe without psychotic features: Secondary | ICD-10-CM | POA: Diagnosis present

## 2021-11-05 DIAGNOSIS — E890 Postprocedural hypothyroidism: Secondary | ICD-10-CM | POA: Diagnosis present

## 2021-11-05 DIAGNOSIS — R45850 Homicidal ideations: Secondary | ICD-10-CM | POA: Diagnosis present

## 2021-11-05 DIAGNOSIS — F17210 Nicotine dependence, cigarettes, uncomplicated: Secondary | ICD-10-CM | POA: Diagnosis present

## 2021-11-05 DIAGNOSIS — R45851 Suicidal ideations: Secondary | ICD-10-CM | POA: Diagnosis present

## 2021-11-05 LAB — RESP PANEL BY RT-PCR (FLU A&B, COVID) ARPGX2
Influenza A by PCR: NEGATIVE
Influenza B by PCR: NEGATIVE
SARS Coronavirus 2 by RT PCR: NEGATIVE

## 2021-11-05 MED ORDER — HYDROXYZINE HCL 25 MG PO TABS
25.0000 mg | ORAL_TABLET | Freq: Three times a day (TID) | ORAL | Status: DC | PRN
  Administered 2021-11-07: 25 mg via ORAL
  Filled 2021-11-05 (×2): qty 1

## 2021-11-05 MED ORDER — TRAZODONE HCL 50 MG PO TABS
50.0000 mg | ORAL_TABLET | Freq: Every evening | ORAL | Status: DC | PRN
  Administered 2021-11-06 – 2021-11-08 (×3): 50 mg via ORAL
  Filled 2021-11-05 (×3): qty 1

## 2021-11-05 MED ORDER — LORAZEPAM 1 MG PO TABS: 1.0000 mg | ORAL_TABLET | ORAL | Status: DC | PRN

## 2021-11-05 MED ORDER — OLANZAPINE 5 MG PO TBDP: 5.0000 mg | ORAL_TABLET | Freq: Three times a day (TID) | ORAL | Status: DC | PRN

## 2021-11-05 MED ORDER — ZIPRASIDONE MESYLATE 20 MG IM SOLR: 20.0000 mg | INTRAMUSCULAR | Status: DC | PRN

## 2021-11-05 MED ORDER — MAGNESIUM HYDROXIDE 400 MG/5ML PO SUSP: 30.0000 mL | Freq: Every day | ORAL | Status: DC | PRN

## 2021-11-05 MED ORDER — RISPERIDONE 2 MG PO TBDP: 2.0000 mg | ORAL_TABLET | Freq: Three times a day (TID) | ORAL | Status: DC | PRN

## 2021-11-05 MED ORDER — ZIPRASIDONE MESYLATE 20 MG IM SOLR
20.0000 mg | INTRAMUSCULAR | Status: AC | PRN
  Administered 2021-11-05: 20 mg via INTRAMUSCULAR
  Filled 2021-11-05: qty 20

## 2021-11-05 MED ORDER — LEVOTHYROXINE SODIUM 50 MCG PO TABS: 125.0000 ug | ORAL_TABLET | Freq: Every day | ORAL | Status: DC

## 2021-11-05 MED ORDER — ALUM & MAG HYDROXIDE-SIMETH 200-200-20 MG/5ML PO SUSP: 30.0000 mL | ORAL | Status: DC | PRN

## 2021-11-05 MED ORDER — ACETAMINOPHEN 325 MG PO TABS: 650.0000 mg | ORAL_TABLET | Freq: Four times a day (QID) | ORAL | Status: DC | PRN

## 2021-11-05 MED ORDER — STERILE WATER FOR INJECTION IJ SOLN
INTRAMUSCULAR | Status: AC
  Administered 2021-11-05: 2.1 mL
  Filled 2021-11-05: qty 10

## 2021-11-05 NOTE — ED Notes (Signed)
Assumed care of pt at 0700.  Pt resting and cooperative at this time.

## 2021-11-05 NOTE — ED Notes (Signed)
Pt asked me to go to the charge desk and to asks the nurses to talk softer and His exact words were (To shut the fuck up) the patient upset with the way the pt in room 15 is acting out.

## 2021-11-05 NOTE — ED Notes (Signed)
Pt moved to family waiting room with [employer] and [facility] for TTS Consult at this time.

## 2021-11-05 NOTE — ED Notes (Signed)
Pt wanting to leave and go to "[place] to get help immediately".  This nurse explained to the pt the process and he continues to want his things to leave.  Provider notified.

## 2021-11-05 NOTE — ED Provider Notes (Signed)
Patient required to be IVC because he left.  I spoke with him and said that we need behavioral health to interview based on your complaints of homicidal and suicidal ideation patient left the emergency department was apprehended by police and brought back.  IVC paperwork completed.  Does appear through notes that behavioral health did try to interview him about 3 in the morning not sure why that did not occur.  Nursing will recheck to see when behavioral health will interview him.  Patient now being cooperative.  Patient is medically cleared.   Fredia Sorrow, MD 11/05/21 [phone]
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
## 2021-11-05 NOTE — ED Provider Notes (Signed)
Emergency Medicine Observation Re-evaluation Note  Mark Grant is a 36 y.o. male, seen on rounds today.  Pt initially presented to the ED for complaints of Homicidal and Suicidal Currently, the patient is redirectable ambulatory and in no distress.  Physical Exam  BP 115/85 (BP Location: Right Arm)    Pulse 82    Temp 97.8 F (36.6 C) (Oral)    Resp 16    Ht 1.778 m (5\' 10" )    Wt 79.4 kg    SpO2 98%    BMI 25.11 kg/m  Physical Exam General: Quiet, disheveled Cardiac: No tachycardia Lungs: Normal respiratory pattern and oxygenation Psych: Flat affect  ED Course / MDM  EKG:   I have reviewed the labs performed to date as well as medications administered while in observation.  Recent changes in the last 24 hours include no acute improvement, still needs admission.  Plan  Current plan is for admission to behavioral health Hospital based on psychiatric evaluation, I have completed the EMTALA form for transfer.  Mark Grant is not under involuntary commitment.     Harlon Flor, MD 11/05/21 2102

## 2021-11-05 NOTE — ED Notes (Signed)
PD notified IVC in place.

## 2021-11-05 NOTE — BH Assessment (Signed)
Clinician message Gladis Riffle. Mayford Knife, RN: "Hey. It's Trey with TTS. Is the pt able to engage in the assessment, if so the pt will need to be placed in a private room. Also is the pt under IVC?"   Clinician waiting response.    Redmond Pulling, MS, [facility], [facility] Triage Specialist [phone]

## 2021-11-05 NOTE — ED Notes (Signed)
[employer] at bedside to provide transport for Pt to [facility]. Pt belongings verified in 2 separate bags. Pt belongings transported with Pt.

## 2021-11-05 NOTE — ED Notes (Signed)
Pt becoming verbally hostile towards Sitter and ED Staff. Pt verbalizing desire to want to "get the fuck out of here." Pt observed standing at bedside in hallway giving ED Staff the middle-finger, threatening to "kill 5 people" once he gets out of here and he'll kill '[place]' (ED Staff) too. [facility] and [employer] notified and called to bedside. [facility] TTS called by charge nurse to speed up process of Pt's TTS Consult.

## 2021-11-05 NOTE — ED Notes (Signed)
Pt walked out of the ER.  Security notified, IVC initiated by provider

## 2021-11-05 NOTE — ED Notes (Signed)
Sitter at bedside.

## 2021-11-05 NOTE — ED Notes (Signed)
Pt sleeping at this time.

## 2021-11-05 NOTE — ED Notes (Signed)
Pt back in the ER per PD

## 2021-11-06 ENCOUNTER — Other Ambulatory Visit: Payer: Self-pay

## 2021-11-06 ENCOUNTER — Encounter (HOSPITAL_COMMUNITY): Payer: Self-pay | Admitting: Nurse Practitioner

## 2021-11-06 DIAGNOSIS — F319 Bipolar disorder, unspecified: Principal | ICD-10-CM

## 2021-11-06 DIAGNOSIS — F1210 Cannabis abuse, uncomplicated: Secondary | ICD-10-CM

## 2021-11-06 DIAGNOSIS — F3181 Bipolar II disorder: Secondary | ICD-10-CM

## 2021-11-06 LAB — TSH
TSH: 121.561 u[IU]/mL — ABNORMAL HIGH (ref 0.350–4.500)
TSH: 80.58 u[IU]/mL — ABNORMAL HIGH (ref 0.350–4.500)

## 2021-11-06 LAB — LIPID PANEL
Cholesterol: 187 mg/dL (ref 0–200)
HDL: 52 mg/dL (ref >40)
LDL Cholesterol: 115 mg/dL — ABNORMAL HIGH (ref 0–99)
Total CHOL/HDL Ratio: 3.6 RATIO
Triglycerides: 101 mg/dL (ref <150)
VLDL: 20 mg/dL (ref 0–40)

## 2021-11-06 LAB — MAGNESIUM: Magnesium: 2.2 mg/dL (ref 1.7–2.4)

## 2021-11-06 MED ORDER — NICOTINE POLACRILEX 2 MG MT GUM
2.0000 mg | CHEWING_GUM | OROMUCOSAL | Status: DC | PRN
  Administered 2021-11-06 – 2021-11-10 (×14): 2 mg via ORAL
  Filled 2021-11-06 (×7): qty 1

## 2021-11-06 MED ORDER — INFLUENZA VAC SPLIT QUAD 0.5 ML IM SUSY: 0.5000 mL | PREFILLED_SYRINGE | INTRAMUSCULAR | Status: DC

## 2021-11-06 MED ORDER — QUETIAPINE FUMARATE 100 MG PO TABS
100.0000 mg | ORAL_TABLET | Freq: Every day | ORAL | Status: DC
  Administered 2021-11-06: 100 mg via ORAL
  Filled 2021-11-06 (×4): qty 1

## 2021-11-06 MED ORDER — PNEUMOCOCCAL VAC POLYVALENT 25 MCG/0.5ML IJ INJ: 0.5000 mL | INJECTION | INTRAMUSCULAR | Status: DC

## 2021-11-06 MED ORDER — LEVOTHYROXINE SODIUM 125 MCG PO TABS
125.0000 ug | ORAL_TABLET | Freq: Every day | ORAL | Status: DC
  Administered 2021-11-06 – 2021-11-07 (×2): 125 ug via ORAL
  Filled 2021-11-06 (×5): qty 1
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
## 2021-11-06 NOTE — BHH Group Notes (Signed)
.  Psychoeducational Group Note    Date:11/06/21 Time: 1300-1400    Purpose of Group: . The group focus' on teaching patients on how to identify their needs and their Life Skills:  A group where two lists are made. What people need and what are things that we do that are unhealthy. The lists are developed by the patients and it is explained that we often do the actions that are not healthy to get our list of needs met.  Goal:: to develop the coping skills needed to get their needs met  Participation Level:  Active  Participation Quality:  Appropriate  Affect:  Appropriate  Cognitive:  Oriented  Insight:  Improving  Engagement in Group:  Engaged  Additional Comments: Rates his energy at a 5/10.Was very active in the group and approached this writer afterwards to ask about  a thesaurus so he could bring up words for the list of needs.   Mark Grant

## 2021-11-06 NOTE — BHH Group Notes (Signed)
Goals Group 11/06/21    Group Focus: affirmation, clarity of thought, and goals/reality orientation Treatment Modality:  Psychoeducation Interventions utilized were assignment, group exercise, and support Purpose: To be able to understand and verbalize the reason for their admission to the hospital. To understand that the medication helps with their chemical imbalance but they also need to work on their choices in life. To be challenged to develop a list of 30 positives about themselves. Also introduce the concept that "feelings" are not reality.  Participation Level:  Active  Participation Quality:  Appropriate  Affect:  Appropriate  Cognitive:  Appropriate  Insight:  Improving  Engagement in Group:  Engaged  Additional Comments:  Pt attended and rates his energy at a 4/10.  States that he had bad thoughts running around in his head to hurt himself or others.   Dione Housekeeper

## 2021-11-06 NOTE — Group Note (Signed)
Date:  11/06/2021 Time:  10:51 AM  Group Topic/Focus:  Orientation:   The focus of this group is to educate the patient on the purpose and policies of crisis stabilization and provide a format to answer questions about their admission.  The group details unit policies and expectations of patients while admitted.    Participation Level:  Active  Participation Quality:  Appropriate  Affect:  Appropriate  Cognitive:  Appropriate  Insight: Appropriate  Engagement in Group:  Engaged  Modes of Intervention:  Discussion Additional Comments:  Engaged   Reymundo Poll 11/06/2021, 10:51 AM
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
## 2021-11-06 NOTE — Progress Notes (Signed)
Dar Note: Patient presents with a calm affect and mood.  Patient visible in milieu interacting with staff and peers.  Reports passive SI but contracts for safety.  Patient attended and participated in group.  Support and encouragement offered as needed.  Patient is safe on and off the unit.

## 2021-11-06 NOTE — Progress Notes (Signed)
[place] Group Notes:  (Nursing/MHT/Case Management/Adjunct)  Date:  11/06/2021  Time:  2015  Type of Therapy:   wrap up group  Participation Level:  Active  Participation Quality:  Appropriate, Attentive, Sharing, and Supportive  Affect:  Flat  Cognitive:  Alert  Insight:  Improving  Engagement in Group:  Engaged  Modes of Intervention:  Clarification, Education, and Support  Summary of Progress/Problems:Positive thinking and positive change were discussed.   Shellia Cleverly 11/06/2021, 9:30 PM

## 2021-11-06 NOTE — Progress Notes (Signed)
Pt reports feeling less tense in comparison to upon admission. Pt said that interacting with his peers has improved his mood some, but he's still adjusting to the new environment. Pt reports that prior to admission he had thoughts of hurting "some old friends and an ex" because they were spreading some rumors about him and portraying him as a "monster." Pt denies SI/HI and AVH. Active listening, reassurance, and support provided. Q 15 min safety checks continue. Pt's safety has been maintained.

## 2021-11-06 NOTE — Progress Notes (Signed)
Patient ID: Mark Grant, male   DOB: Apr 04, 1986, 36 y.o.   MRN: [id number]  D: Pt here IVC from [facility]. Pt denies SI/HI/AVH and pain at this time. Pt states he has been "in the ED for 24 hours sitting in a hallway under bright lights getting very little sleep." Pt states that he moved back to [place] with his mother 5 months ago and has been depressed ever since. Endorses anhedonia, sleeping a lot, anxiety, lack of concentration, frustration and depression. Pt states his girlfriend left him when he began confiding and sharing his issues with a former girlfriend after she was hanging out with former boyfriends. "She hangs out with a lot of 'guy friends' and some of them are people she used to date. So, I started talking to my ex Mark Grant). She's the only one besides my girlfriend that I could really talk to about stuff. When she found out, she dumped me." Pt lost his job (in [place] at [employer]) because it was too far to travel from his home (in [place]) and he was working 12-hour shifts. "I had to let it go. It was too much."   Pt moved back in with his mother and is currently unemployed and contemplating his future options. Pt wants to work on finding a therapist/counselor to speak with and sort some things out. "What's really got me down is not the girlfriend or even moving back in with my mom, but the money. I've always had a job since I was 34. I miss being able to do fun stuff when I had my own money."   This is pt's first time inpatient for mental health reasons. Pt has family history of mental illness. Denies access to guns. Denies wanting to hurt himself or anyone else. Pt endorses daily cannabis use, occasional alcohol use (while watching football game), and daily tobacco use. Pt had thyroid removed because of abnormal function. Pt has history of asthma.    A: Pt was offered support and encouragement. Pt is cooperative during assessment. VS assessed and admission paperwork signed. Belongings  searched and contraband items placed in locker. Non-invasive skin search completed: surgical scar midline chest, eczema-type rash lower back, freckles.. Pt offered food and drink and both accepted. Pt introduced to unit milieu by nursing staff. Q 15 minute checks were started for safety.   R: Pt in room. Pt safety maintained on unit.

## 2021-11-06 NOTE — Progress Notes (Signed)
°   11/05/21 2345  Psych Admission Type (Psych Patients Only)  Admission Status Involuntary  Psychosocial Assessment  Patient Complaints Anxiety;Anhedonia;Decreased concentration;Sleep disturbance (sleeping too much; upset over being here - says he just wanted to speak with someone about his issues)  Eye Contact Fair  Facial Expression Anxious  Affect Anxious  Speech Logical/coherent  Interaction Assertive  Motor Activity Other (Comment) (wnl)  Appearance/Hygiene Unremarkable;In scrubs  Behavior Characteristics Cooperative;Appropriate to situation  Mood Anxious;Pleasant  Thought Process  Coherency WDL  Content WDL  Delusions None reported or observed  Perception WDL  Hallucination None reported or observed  Judgment Poor  Confusion None  Danger to Self  Current suicidal ideation? Denies  Danger to Others  Danger to Others None reported or observed   Pt denies SI, HI, AVH and pain at this time. Says he came to the ED because his mother wanted him too. She felt there was something wrong. Pt states he does not want to kill himself or anyone else.

## 2021-11-06 NOTE — Group Note (Signed)
LCSW Group Therapy Note  11/06/2021    10:00-11:00am   Topic:  Anger Healthy and Unhealthy Coping Skills  Participation Level:  Active  Description of Group:   In this group, patients identified their own common triggers and typical reactions then analyzed how these reactions are possibly beneficial and possibly unhelpful.  Focus was placed on examining whether typical coping skills are healthy or unhealthy.  Therapeutic Goals: Patients will share situations that commonly incite their anger and how they typically respond Patients will identify how their coping skills work for them and/or against them Patients will explore possible alternative coping skills Patients will learn that anger itself is normal and that healthier reactions can assist with resolving conflict rather than worsening situations  Summary of Patient Progress:  The patient shared that his frequent cause anger is people who are judgmental or actively look for flaws in others.  Choice of coping skill is often to swallow this anger, which was identified as an unhealthy choice because he becomes "like a pressure cooker whose top will eventually blow, and when it does, it's ugly."  He participated fully and with much encouragement to others.  Therapeutic Modalities:   Cognitive Behavioral Therapy Processing  Lynnell Chad

## 2021-11-06 NOTE — Tx Team (Signed)
Initial Treatment Plan 11/06/2021 1:37 AM Mark Grant [id number]    PATIENT STRESSORS: Financial difficulties   Loss of job, girlfriend   Occupational concerns   Substance abuse     PATIENT STRENGTHS: Average or above average intelligence  Capable of independent living  Motivation for treatment/growth  Supportive family/friends    PATIENT IDENTIFIED PROBLEMS: Depression  SI/HI (says he doesn't feel this way)  Cannabis use d/o  Tobacco use d/o  ("Pt wants to find a therapist/counselor to talk to")             DISCHARGE CRITERIA:  Improved stabilization in mood, thinking, and/or behavior Motivation to continue treatment in a less acute level of care Verbal commitment to aftercare and medication compliance  PRELIMINARY DISCHARGE PLAN: Attend aftercare/continuing care group Outpatient therapy Return to previous living arrangement  PATIENT/FAMILY INVOLVEMENT: This treatment plan has been presented to and reviewed with the patient, Mark Grant, and/or family member.  The patient and family have been given the opportunity to ask questions and make suggestions.  Victorino December, RN 11/06/2021, 1:37 AM

## 2021-11-07 LAB — T4, FREE: Free T4: 0.35 ng/dL — ABNORMAL LOW (ref 0.61–1.12)

## 2021-11-07 MED ORDER — LEVOTHYROXINE SODIUM 125 MCG PO TABS
125.0000 ug | ORAL_TABLET | Freq: Every day | ORAL | Status: DC
  Administered 2021-11-08 – 2021-11-10 (×3): 125 ug via ORAL
  Filled 2021-11-07: qty 1
  Filled 2021-11-07: qty 7
  Filled 2021-11-07 (×3): qty 1

## 2021-11-07 MED ORDER — QUETIAPINE FUMARATE 200 MG PO TABS
200.0000 mg | ORAL_TABLET | Freq: Every day | ORAL | Status: DC
  Administered 2021-11-07: 200 mg via ORAL
  Filled 2021-11-07 (×3): qty 1

## 2021-11-07 NOTE — Progress Notes (Addendum)
[facility]  11/07/2021 4:36 PM Mark FlorJonathan K Supan  MRN: [id number]  CC: SI and HI   Subjective: Mark Grant is a 36 y.o. male with no past psychiatric history who was put under involuntary commitment at the [facility] emergency department for suicidal and homicidal statements. He was admitted to the behavioral health hospital currently under Involuntary commitment. BHH Day 2   Overnight Events: No acute events overnight or behavioral issues noted in chart. Patient was compliant with scheduled meds, no agitation PRN's required, and attended group therapies appropriately. PRNs: Vistaril  Interim History: Patient was evaluated today, and appeared drowsy and was calm and cooperative with the evaluation.  His movement appeared slowed and sluggish.Patient reported feeling "calm", rating his anxiety and depression both 6/10, with 10/10 being the most anxious and depressed.  He endorsed fleeting passive HI to "someone that is not here, that led me to be here", without intention or plan.  Patient did not disclose who this person was.  He reported good sleep and appetite, with improved energy level.  Discussed with patient the importance of his medications and his diagnoses.  Patient then requested to have his Synthroid earlier, as he has it not long before eating breakfast. Patient denied medication side effects and is tolerating it well. Patient denied SI/AVH, delusions, paranoia, first rank symptoms, and contracted to safety on the unit. Patient was not grossly responding to internal/external stimuli nor made any delusional statements during encounter. Otherwise, patient had no other concerns or questions and was amenable to the plan.   Diagnosis: Principal Problem:   Bipolar disorder, unspecified (HCC) Active Problems:   Cannabis abuse  Total Time spent with patient:  I personally spent 30 minutes on the unit in direct patient care. The direct patient care time included face-to-face  time with the patient, reviewing the patient's chart, communicating with other professionals, and coordinating care. Greater than 50% of this time was spent in counseling or coordinating care with the patient regarding goals of hospitalization, psycho-education, and discharge planning needs.   Past Psychiatric History: See H&P  Past Medical History:  Past Medical History:  Diagnosis Date   Asthma    Thyroid disease     Past Surgical History:  Procedure Laterality Date   THYROIDECTOMY     Family History:  History reviewed. No pertinent family history. Family Psychiatric History: See H&P Social History:  Social History   Substance and Sexual Activity  Alcohol Use Yes   Comment: occasional     Social History   Substance and Sexual Activity  Drug Use Yes   Types: Marijuana   Comment: daily use    Social History   Socioeconomic History   Marital status: Single    Spouse name: Not on file   Number of children: Not on file   Years of education: Not on file   Highest education level: Not on file  Occupational History   Not on file  Tobacco Use   Smoking status: Every Day    Types: Cigarettes   Smokeless tobacco: Former  Building services engineerVaping Use   Vaping Use: Some days   Substances: Nicotine  Substance and Sexual Activity   Alcohol use: Yes    Comment: occasional   Drug use: Yes    Types: Marijuana    Comment: daily use   Sexual activity: Not on file  Other Topics Concern   Not on file  Social History Narrative   Not on file   Social Determinants of  Health   Financial Resource Strain: Not on file  Food Insecurity: Not on file  Transportation Needs: Not on file  Physical Activity: Not on file  Stress: Not on file  Social Connections: Not on file    Appetite: Fair  Current Medications: Current Facility-Administered Medications  Medication Dose Route Frequency Provider Last Rate Last Admin   acetaminophen (TYLENOL) tablet 650 mg  650 mg Oral Q6H PRN Bobbitt, Shalon E,  NP       alum & mag hydroxide-simeth (MAALOX/MYLANTA) 200-200-20 MG/5ML suspension 30 mL  30 mL Oral Q4H PRN Bobbitt, Shalon E, NP       hydrOXYzine (ATARAX) tablet 25 mg  25 mg Oral TID PRN Bobbitt, Shalon E, NP   25 mg at 11/07/21 0742   [START ON 11/08/2021] levothyroxine (SYNTHROID) tablet 125 mcg  125 mcg Oral Q0600 Princess Bruins, DO       risperiDONE (RISPERDAL M-TABS) disintegrating tablet 2 mg  2 mg Oral Q8H PRN Bobbitt, Shalon E, NP       And   LORazepam (ATIVAN) tablet 1 mg  1 mg Oral PRN Bobbitt, Shalon E, NP       And   ziprasidone (GEODON) injection 20 mg  20 mg Intramuscular PRN Bobbitt, Shalon E, NP       magnesium hydroxide (MILK OF MAGNESIA) suspension 30 mL  30 mL Oral Daily PRN Bobbitt, Shalon E, NP       nicotine polacrilex (NICORETTE) gum 2 mg  2 mg Oral PRN Bobbitt, Shalon E, NP   2 mg at 11/07/21 1604   QUEtiapine (SEROQUEL) tablet 200 mg  200 mg Oral QHS Princess Bruins, DO       traZODone (DESYREL) tablet 50 mg  50 mg Oral QHS PRN Bobbitt, Shalon E, NP   50 mg at 11/06/21 2119    Lab Results:  Results for orders placed or performed during the hospital encounter of 11/05/21 (from the past 48 hour(s))  Magnesium     Status: None   Collection Time: 11/06/21  6:18 AM  Result Value Ref Range   Magnesium 2.2 1.7 - 2.4 mg/dL    Comment: Performed at [facility], [address] [place], [place] [address]  Lipid panel     Status: Abnormal   Collection Time: 11/06/21  6:18 AM  Result Value Ref Range   Cholesterol 187 0 - 200 mg/dL   Triglycerides 657 <846 mg/dL   HDL 52 >96 mg/dL   Total CHOL/HDL Ratio 3.6 RATIO   VLDL 20 0 - 40 mg/dL   LDL Cholesterol 295 (H) 0 - 99 mg/dL    Comment:        Total Cholesterol/HDL:CHD Risk Coronary Heart Disease Risk Table                     Men   Women  1/2 Average Risk   3.4   3.3  Average Risk       5.0   4.4  2 X Average Risk   9.6   7.1  3 X Average Risk  23.4   11.0        Use the calculated Patient  Ratio above and the CHD Risk Table to determine the patient's CHD Risk.        ATP III CLASSIFICATION (LDL):  <100     mg/dL   Optimal  284-132  mg/dL   Near or Above  Optimal  130-159  mg/dL   Borderline  412-878  mg/dL   High  >676     mg/dL   Very High Performed at [facility], [address] [place], [place] [address]   TSH     Status: Abnormal   Collection Time: 11/06/21  6:18 AM  Result Value Ref Range   TSH 121.561 (H) 0.350 - 4.500 uIU/mL    Comment: Performed by a 3rd Generation assay with a functional sensitivity of <=0.01 uIU/mL. Performed at [facility], [address] [place], [place] [address]   T4, free     Status: Abnormal   Collection Time: 11/06/21  6:34 PM  Result Value Ref Range   Free T4 0.35 (L) 0.61 - 1.12 ng/dL    Comment: (NOTE) Biotin ingestion may interfere with free T4 tests. If the results are inconsistent with the TSH level, previous test results, or the clinical presentation, then consider biotin interference. If needed, order repeat testing after stopping biotin. Performed at [facility], [address] [place], [place] [address]   TSH     Status: Abnormal   Collection Time: 11/06/21  6:34 PM  Result Value Ref Range   TSH 80.580 (H) 0.350 - 4.500 uIU/mL    Comment: Performed by a 3rd Generation assay with a functional sensitivity of <=0.01 uIU/mL. Performed at [facility], [address] [place], [place] [address]     Blood Alcohol level:  Lab Results  Component Value Date   ETH <10 11/04/2021    Metabolic Disorder Labs: No results found for: HGBA1C, MPG No results found for: PROLACTIN Lab Results  Component Value Date   CHOL 187 11/06/2021   TRIG 101 11/06/2021   HDL 52 11/06/2021   CHOLHDL 3.6 11/06/2021   VLDL 20 11/06/2021   LDLCALC 115 (H) 11/06/2021    Physical Findings: CIWA:  CIWA-Ar Total: 0  Musculoskeletal: Strength &  Muscle Tone: within normal limits Gait & Station: normal Patient leans: N/A  Psychiatric Specialty Exam: Presentation  General Appearance: casually dressed, fair hygiene  Eye Contact: Fair  Speech: clear an coherent, regular rate  Speech Volume: Normal  Handedness: Right  Mood and Affect  Mood: described as improving - appears calm but dysphoric  Affect: Blunt  Thought Process  Thought Processes: Coherent; Goal Directed; Linear  Orientation: Full (Time, Place and Person)  Thought Content: Reports passive HI but contracts for safety; no SI, AVH, paranoia, delusions, ideas of reference, or first rank symptoms; is not grossly responding to internal/external stimuli on exam  History of Schizophrenia/Schizoaffective disorder: No  Hallucinations: Hallucinations: None  Ideas of Reference: None  Suicidal Thoughts: Suicidal Thoughts: No  Homicidal Thoughts: Homicidal Thoughts: Yes, Passive HI Passive Intent and/or Plan: Without Intent; Without Plan   Sensorium  Memory: Immediate Fair; Recent Fair; Remote Fair Judgment: Fair  Insight: Shallow  Executive Functions  Concentration: Fair Attention Span: Fair Recall: Fair Fund of Knowledge: Fair Language: Good  Psychomotor Activity  Psychomotor Activity:Normal Assets  Assets:Communication Skills; Desire for Improvement; Housing; Social Support  Sleep  Total time unrecorded  Physical Exam: Body mass index is 25.97 kg/m. Temp:  [97.8 F (36.6 C)] 97.8 F (36.6 C) (01/22 0623) Pulse Rate:  [97-102] 101 (01/22 0624) BP: (130-142)/(94-103) 130/94 (01/22 0624) SpO2:  [100 %] 100 % (01/22 4650)  Physical Exam Vitals and nursing note reviewed.  Constitutional:      General: He is awake. He is not in acute  distress.    Appearance: He is not ill-appearing or diaphoretic.  HENT:     Head: Normocephalic.  Pulmonary:     Effort: Pulmonary effort is normal. No respiratory distress.  Neurological:     General: No  focal deficit present.     Mental Status: He is alert and oriented to person, place, and time.  Psychiatric:        Behavior: Behavior is cooperative.    Review of Systems  Respiratory:  Negative for shortness of breath.   Cardiovascular:  Negative for chest pain.  Gastrointestinal:  Negative for nausea and vomiting.  Neurological:  Negative for dizziness and headaches.   Treatment Assessment & Plan Summary: Principal Problem:   Bipolar disorder, unspecified (HCC) Active Problems:   Cannabis abuse   GUSTIN ZOBRIST is a 36 y.o. male with no past psychiatric history who was put under involuntary commitment at the [facility] emergency department for suicidal and homicidal statements. He was admitted to the behavioral health hospital currently under Involuntary commitment. BHH Day 2   PLAN: Unspecified bipolar and related d/o (r/o Bipolar II vs I; r/o SIMD, r/o mood d/o secondary to general medical illness - hypothyroidism; r/o IED) Alcohol use - r/o alcohol use d/o Cannabis use disorder Tobacco use disorder   Unspecified bipolar and related d/o (r/o Bipolar II vs I; r/o SIMD, r/o mood d/o secondary to general medical illness - hypothyroidism; r/o IED) -Increased Seroquel 100 mg to 200 mg nightly for mood stabilization -Pt educated on r/b/se to medication (TD/EPS/WG) and in agreement Antipsychotic labs             EKG: NSR, Qtc 439             Lipids: LDL of 115, otherwise unremarkable             A1C: pending, BMI of 26 AIMS: not yet assessed - Reassess for start of possible antidepressant once he has adequate mood stabilizer on board - Would benefit from psychotherapy after discharge   Cannabis use d/o Alcohol use - r/o alcohol use d/o Tobacco use d/o -Nicotine gum -encourage abstinence -CIWA monitoring with Ativan 1mg  for score >10 (recent scores 0,0) - po thiamine and MVI replacement   Medical Management Covid negative CMP: unremarkable CBC: unremarkable EtOH:  <10 UDS: THC TSH: 120 > 80.58 Free T4 0.35 Free T3 pending A1C: pending Mg2+ WNL Lipids: LDL of 115, otherwise unremarkable EKG: NSR, Qtc of 439   Hypothyroidism s/p thyroidectomy  Patient reports self-reducing his synthroid dose from 125 to 50 mcg due to perceived side effect of constipation. Hypothyroidism may be contributing to his depression.  Downtrending TSH after starting home Synthroid with low FT4. -Continued Synthroid at home dose of 125 mcg; FT3 pending  Dispo: Pcp, No-we will work with CSW for PCP referral at discharge PCP: Hypothyroidism  Safety, monitoring and disposition planning: The patient was seen and evaluated on the unit.  The patient's chart was reviewed and nursing notes were reviewed.  The patient's case was discussed in multidisciplinary team meeting.  Plan and drug side effects were discussed with patient who was amendable. Social work and case management to assist with discharge planning and identification of hospital follow-up needs prior to discharge. Discharge Concerns: Need to establish a safety plan; Medication compliance and effectiveness Discharge Goals: Return home with outpatient referrals for mental health follow-up including medication management/psychotherapy Safety and Monitoring: Involuntary status at inpatient psychiatric unit for safety, stabilization and treatment Daily contact with patient  to assess and evaluate symptoms and progress in treatment and medical management Patient's case to be discussed in multi-disciplinary team meeting Observation Level: Per chart Vital signs:  q12 hours Precautions: Per chart  Signed: Princess BruinsJulie Nguyen, DO Psychiatry Resident, PGY-1 [facility] 11/07/2021, 4:36 PM

## 2021-11-07 NOTE — Progress Notes (Signed)
°   11/07/21 1300  °Psych Admission Type (Psych Patients Only)  °Admission Status Involuntary  °Psychosocial Assessment  °Patient Complaints Anxiety  °Eye Contact Fair  °Facial Expression Flat  °Affect Appropriate to circumstance  °Speech Logical/coherent  °Interaction Assertive  °Motor Activity Other (Comment) °(wnl)  °Appearance/Hygiene Unremarkable  °Behavior Characteristics Cooperative;Anxious  °Mood Anxious;Pleasant  °Thought Process  °Coherency WDL  °Content WDL  °Delusions None reported or observed  °Perception WDL  °Hallucination None reported or observed  °Judgment Poor  °Confusion None  °Danger to Self  °Current suicidal ideation? Denies  °Danger to Others  °Danger to Others None reported or observed  ° ° °

## 2021-11-07 NOTE — BHH Group Notes (Signed)
Psychoeducational Group Note  Date:  10/24/2021 Time:  1300-1400   Group Topic/Focus: This is a continuation of the group from Saturday. Pt's have been asked to formulate a list of 30 positives about themselves. This list is to be read 2 times a day for 30 days, looking in a mirror. Changing patterns of negative self talk. Also discussed is the fact that there have been some people who hurt [place] in the past. We keep that memory alive within [place]. Ways to cope with this are discused   Participation Level:  Active  Participation Quality:  Appropriate  Affect:  Appropriate  Cognitive:  Oriented  Insight: Improving  Engagement in Group:  Engaged  Modes of Intervention:  Activity, Discussion, Education, and Support  Additional Comments:  pt rates his energy at a 7/10. Was quite invested in the group and participated fully in the group. Did his homework and committed to read the list to himself daily for a month. Given support and reassurance.  Dione Housekeeper

## 2021-11-07 NOTE — Progress Notes (Signed)
Patient has been up in the dayroom watching tv after attending group. Vitals were taken tonight and his blood pressure was high 136/103. He denied having any dizziness or light headed. He reported that he felt fine. Writer asked if he had ever taken blood pressure medication and he denied. He was informed to alert staff if feeling any dizziness and or feeling light headed. He agreed.

## 2021-11-07 NOTE — Group Note (Signed)
Date:  11/07/2021 Time:  9:59 AM  Group Topic/Focus:  Orientation:   The focus of this group is to educate the patient on the purpose and policies of crisis stabilization and provide a format to answer questions about their admission.  The group details unit policies and expectations of patients while admitted.    Participation Level:  Active  Participation Quality:  Appropriate  Affect:  Appropriate  Cognitive:  Appropriate  Insight: Appropriate  Engagement in Group:  Engaged  Modes of Intervention:  Discussion  Additional Comments:    Jaquita Rector 11/07/2021, 9:59 AM

## 2021-11-07 NOTE — Progress Notes (Signed)
°   11/07/21 1300  Psych Admission Type (Psych Patients Only)  Admission Status Involuntary  Psychosocial Assessment  Patient Complaints Anxiety  Eye Contact Fair  Facial Expression Flat  Affect Appropriate to circumstance  Speech Logical/coherent  Interaction Assertive  Motor Activity Other (Comment) (wnl)  Appearance/Hygiene Unremarkable  Behavior Characteristics Cooperative;Anxious  Mood Anxious;Pleasant  Thought Process  Coherency WDL  Content WDL  Delusions None reported or observed  Perception WDL  Hallucination None reported or observed  Judgment Poor  Confusion None  Danger to Self  Current suicidal ideation? Denies  Danger to Others  Danger to Others None reported or observed

## 2021-11-07 NOTE — BHH Group Notes (Signed)
Adult Psychoeducational Group Not Date:  11/07/2021 Time:  3419-3790 Group Topic/Focus: PROGRESSIVE RELAXATION. A group where deep breathing is taught and tensing and relaxation muscle groups is used. Imagery is used as well.  Pts are asked to imagine 3 pillars that hold them up when they are not able to hold themselves up.  Participation Level:  Active  Participation Quality:  Appropriate  Affect:  Appropriate  Cognitive:  Oriented  Insight: Improving  Engagement in Group:  Engaged  Modes of Intervention:  Activity, Discussion, Education, and Support  Additional Comments:  Attended the group.Rates his energy at a 6. What holds him up and supports him is his spirituality and his friends  Dione Housekeeper

## 2021-11-07 NOTE — Progress Notes (Signed)
BHH Group Notes:  (Nursing/MHT/Case Management/Adjunct)  Date:  11/07/2021  Time:  2015 Type of Therapy:   wrap up group  Participation Level:  Active  Participation Quality:  Appropriate, Attentive, Sharing, and Supportive  Affect:  Appropriate  Cognitive:  Alert  Insight:  Improving  Engagement in Group:  Engaged  Modes of Intervention:  Clarification, Education, and Support  Summary of Progress/Problems: Positive thinking and self-care were discussed.   Marcille Buffy 11/07/2021, 9:57 PM

## 2021-11-07 NOTE — BHH Suicide Risk Assessment (Signed)
BHH INPATIENT:  Family/Significant Other Suicide Prevention Education  Suicide Prevention Education:  Education Completed; mother Keene Gilkey [phone],  (name of family member/significant other) has been identified by the patient as the family member/significant other with whom the patient will be residing, and identified as the person(s) who will aid the patient in the event of a mental health crisis (suicidal ideations/suicide attempt).    Mother stated there are no guns in the home.  She was sorry that patient was angry with her when she called for help, but stated that in talking with him now, he is feeling much better and is very receptive to the help he is receiving.  Mother is currently undergoing radiation treatment for cancer and said she needs him staying with her, even though he has expressed concern that he is bothering her routine.  She asked how to go about asking him about his appointments and medicines, was given suggestions.    Mother asked that any appointments set up by hospital be scheduled for mid-day (11am-2pm) because of how long it takes her to become functional due to her cancer and the treatments.  With written consent from the patient, the family member/significant other has been provided the following suicide prevention education, prior to the and/or following the discharge of the patient.  The suicide prevention education provided includes the following: Suicide risk factors Suicide prevention and interventions National Suicide Hotline telephone number [facility] assessment telephone number [facility] Emergency Assistance 911 [facility] and/or Residential Mobile Crisis Unit telephone number  Request made of family/significant other to: Remove weapons (e.g., guns, rifles, knives), all items previously/currently identified as safety concern.   Remove drugs/medications (over-the-counter, prescriptions, illicit drugs), all items  previously/currently identified as a safety concern.  The family member/significant other verbalizes understanding of the suicide prevention education information provided.  The family member/significant other agrees to remove the items of safety concern listed above.  Carloyn Jaeger Grossman-Orr 11/07/2021, 5:03 PM

## 2021-11-08 ENCOUNTER — Encounter (HOSPITAL_COMMUNITY): Payer: Self-pay

## 2021-11-08 LAB — HEMOGLOBIN A1C
Hgb A1c MFr Bld: 4.9 % (ref 4.8–5.6)
Mean Plasma Glucose: 94 mg/dL

## 2021-11-08 LAB — T3, FREE: T3, Free: 1.4 pg/mL — ABNORMAL LOW (ref 2.0–4.4)

## 2021-11-08 MED ORDER — QUETIAPINE FUMARATE 300 MG PO TABS
300.0000 mg | ORAL_TABLET | Freq: Every day | ORAL | Status: DC
  Administered 2021-11-08 – 2021-11-09 (×2): 300 mg via ORAL
  Filled 2021-11-08 (×4): qty 1
  Filled 2021-11-08: qty 7

## 2021-11-08 NOTE — Group Note (Signed)
LCSW Group Therapy Note   Group Date: 11/08/2021 Start Time: 1300 End Time: 1400   Type of Therapy and Topic:  Group Therapy:  identifying Supports and their Affects  Participation Level:  Active   Description of Group:  Patients in this group were introduced to the idea of adding a variety of healthy supports to address the various needs in their lives.Patients discussed what additional healthy supports could be helpful in their recovery and wellness after discharge in order to prevent future hospitalizations.   An emphasis was placed on using counselor, doctor, therapy groups, 12-step groups, and problem-specific support groups to expand supports.  They also worked as a group on developing a specific plan for several patients to deal with unhealthy supports through boundary-setting, psychoeducation with loved ones, and even termination of relationships.   Therapeutic Goals:   1)  discuss importance of adding supports to stay well once out of the hospital  2)  complete ecomap  3)  generate ideas and descriptions of healthy supports that can be added  4)  offer mutual support during discussion  5)  encourage active participation in and adherence to discharge plan    Summary of Patient Progress:  The patient stated that current healthy supports in his life are his cousin and his oldest sister.  Patient was appropriate during group discussion.   Therapeutic Modalities:   Motivational Interviewing Brief Solution-Focused Therapy  Felizardo Hoffmann, [place] 11/08/2021  1:35 PM

## 2021-11-08 NOTE — Progress Notes (Signed)
°   11/07/21 2115  Psych Admission Type (Psych Patients Only)  Admission Status Involuntary  Psychosocial Assessment  Patient Complaints None  Eye Contact Fair  Facial Expression Flat  Affect Appropriate to circumstance  Speech Logical/coherent  Interaction Assertive  Motor Activity Other (Comment) (wnl)  Appearance/Hygiene Unremarkable  Behavior Characteristics Cooperative;Appropriate to situation  Thought Process  Coherency WDL  Content WDL  Delusions None reported or observed  Perception WDL  Hallucination None reported or observed  Judgment Poor  Confusion None  Danger to Self  Current suicidal ideation? Denies  Danger to Others  Danger to Others None reported or observed

## 2021-11-08 NOTE — Progress Notes (Signed)
Pt reports he slept well last night with good appetite. Rates his anxiety and depression both 4/10 with current stressor being "Life in general. Money, family and relationship". Reports his medications are working well for him. Per pt "I'm able to socialize more, my thoughts are clearer. I still have my regular racing thoughts, those are not going away". Pt attended scheduled groups, engaged appropriately with others. Visible in milieu majority of this shift. Emotional support and reassurance provided to pt this shift. Safety checks maintained at Q 15 minutes intervals without self harm gestures. All medications administered as ordered and effects monitored.  Pt tolerates all meals and medications well. Remains safe on and off unit.

## 2021-11-08 NOTE — BH IP Treatment Plan (Signed)
Interdisciplinary Treatment and Diagnostic Plan Update  11/08/2021 Time of Session: 9:50am Mark Grant MRN: [id number]  Principal Diagnosis: Bipolar disorder, unspecified ([place])  Secondary Diagnoses: Principal Problem:   Bipolar disorder, unspecified ([place]) Active Problems:   Cannabis abuse   Current Medications:  Current Facility-Administered Medications  Medication Dose Route Frequency Provider Last Rate Last Admin   acetaminophen (TYLENOL) tablet 650 mg  650 mg Oral Q6H PRN Bobbitt, Shalon E, NP       alum & mag hydroxide-simeth (MAALOX/MYLANTA) 200-200-20 MG/5ML suspension 30 mL  30 mL Oral Q4H PRN Bobbitt, Shalon E, NP       hydrOXYzine (ATARAX) tablet 25 mg  25 mg Oral TID PRN Bobbitt, Shalon E, NP   25 mg at 11/07/21 0742   levothyroxine (SYNTHROID) tablet 125 mcg  125 mcg Oral Q0600 Merrily Brittle, DO   125 mcg at 11/08/21 7416   risperiDONE (RISPERDAL M-TABS) disintegrating tablet 2 mg  2 mg Oral Q8H PRN Bobbitt, Shalon E, NP       And   LORazepam (ATIVAN) tablet 1 mg  1 mg Oral PRN Bobbitt, Shalon E, NP       And   ziprasidone (GEODON) injection 20 mg  20 mg Intramuscular PRN Bobbitt, Shalon E, NP       magnesium hydroxide (MILK OF MAGNESIA) suspension 30 mL  30 mL Oral Daily PRN Bobbitt, Shalon E, NP       nicotine polacrilex (NICORETTE) gum 2 mg  2 mg Oral PRN Bobbitt, Shalon E, NP   2 mg at 11/08/21 0734   QUEtiapine (SEROQUEL) tablet 200 mg  200 mg Oral QHS Merrily Brittle, DO   200 mg at 11/07/21 2114   traZODone (DESYREL) tablet 50 mg  50 mg Oral QHS PRN Bobbitt, Shalon E, NP   50 mg at 11/07/21 2114   PTA Medications: Medications Prior to Admission  Medication Sig Dispense Refill Last Dose   levothyroxine (SYNTHROID) 125 MCG tablet Take 1 tablet (125 mcg total) by mouth daily before breakfast. 30 tablet 0    albuterol (VENTOLIN HFA) 108 (90 Base) MCG/ACT inhaler Inhale into the lungs every 6 (six) hours as needed. (Patient not taking: Reported on 11/05/2021)        Patient Stressors: Financial difficulties   Loss of job, girlfriend   Occupational concerns   Substance abuse    Patient Strengths: Average or above average intelligence  Capable of independent living  Motivation for treatment/growth  Supportive family/friends   Treatment Modalities: Medication Management, Group therapy, Case management,  1 to 1 session with clinician, Psychoeducation, Recreational therapy.   Physician Treatment Plan for Primary Diagnosis: Bipolar disorder, unspecified ([place]) Long Term Goal(s): Improvement in symptoms so as ready for discharge   Short Term Goals: Ability to verbalize feelings will improve Ability to identify and develop effective coping behaviors will improve Ability to maintain clinical measurements within normal limits will improve Compliance with prescribed medications will improve  Medication Management: Evaluate patient's response, side effects, and tolerance of medication regimen.  Therapeutic Interventions: 1 to 1 sessions, Unit Group sessions and Medication administration.  Evaluation of Outcomes: Not Met  Physician Treatment Plan for Secondary Diagnosis: Principal Problem:   Bipolar disorder, unspecified ([place]) Active Problems:   Cannabis abuse  Long Term Goal(s): Improvement in symptoms so as ready for discharge   Short Term Goals: Ability to verbalize feelings will improve Ability to identify and develop effective coping behaviors will improve Ability to maintain clinical measurements within normal limits will  improve Compliance with prescribed medications will improve     Medication Management: Evaluate patient's response, side effects, and tolerance of medication regimen.  Therapeutic Interventions: 1 to 1 sessions, Unit Group sessions and Medication administration.  Evaluation of Outcomes: Not Met   RN Treatment Plan for Primary Diagnosis: Bipolar disorder, unspecified ([place]) Long Term Goal(s): Knowledge of disease and  therapeutic regimen to maintain health will improve  Short Term Goals: Ability to remain free from injury will improve, Ability to verbalize frustration and anger appropriately will improve, Ability to demonstrate self-control, Ability to identify and develop effective coping behaviors will improve, and Compliance with prescribed medications will improve  Medication Management: RN will administer medications as ordered by provider, will assess and evaluate patient's response and provide education to patient for prescribed medication. RN will report any adverse and/or side effects to prescribing provider.  Therapeutic Interventions: 1 on 1 counseling sessions, Psychoeducation, Medication administration, Evaluate responses to treatment, Monitor vital signs and CBGs as ordered, Perform/monitor CIWA, COWS, AIMS and Fall Risk screenings as ordered, Perform wound care treatments as ordered.  Evaluation of Outcomes: Not Met   LCSW Treatment Plan for Primary Diagnosis: Bipolar disorder, unspecified ([place]) Long Term Goal(s): Safe transition to appropriate next level of care at discharge, Engage patient in therapeutic group addressing interpersonal concerns.  Short Term Goals: Engage patient in aftercare planning with referrals and resources, Increase social support, Increase ability to appropriately verbalize feelings, Increase emotional regulation, Identify triggers associated with mental health/substance abuse issues, and Increase skills for wellness and recovery  Therapeutic Interventions: Assess for all discharge needs, 1 to 1 time with Social worker, Explore available resources and support systems, Assess for adequacy in community support network, Educate family and significant other(s) on suicide prevention, Complete Psychosocial Assessment, Interpersonal group therapy.  Evaluation of Outcomes: Not Met   Progress in Treatment: Attending groups: Yes. Participating in groups: Yes. Taking medication  as prescribed: Yes. Toleration medication: Yes. Family/Significant other contact made: Yes, individual(s) contacted:  mother Patient understands diagnosis: Yes. Discussing patient identified problems/goals with staff: Yes. Medical problems stabilized or resolved: Yes. Denies suicidal/homicidal ideation: Yes. Issues/concerns per patient self-inventory: No.   New problem(s) identified: No, Describe:  none  New Short Term/Long Term Goal(s): detox, medication management for mood stabilization; elimination of SI thoughts; development of comprehensive mental wellness/sobriety plan  Patient Goals: "To not isolate, stay away from triggers for my depression"   Discharge Plan or Barriers: Patient recently admitted. CSW will continue to follow and assess for appropriate referrals and possible discharge planning.    Reason for Continuation of Hospitalization: Anxiety Depression Medication stabilization Suicidal ideation  Estimated Length of Stay: 3-5 days   Scribe for Treatment Team: Vassie Moselle, LCSW 11/08/2021 11:10 AM

## 2021-11-08 NOTE — Progress Notes (Signed)
The patient attended the evening A.A.meeting and was appropriate.  

## 2021-11-08 NOTE — Progress Notes (Signed)
Pt stated he was doing better, pt stated he appreciated writer talking to pt on admission .     11/08/21 2300  Psych Admission Type (Psych Patients Only)  Admission Status Involuntary  Psychosocial Assessment  Patient Complaints None  Eye Contact Fair  Facial Expression Flat  Affect Appropriate to circumstance  Speech Logical/coherent  Interaction Assertive  Motor Activity Other (Comment) (wnl)  Appearance/Hygiene Unremarkable  Behavior Characteristics Cooperative  Mood Anxious  Aggressive Behavior  Effect No apparent injury  Thought Process  Coherency WDL  Content WDL  Delusions None reported or observed  Perception WDL  Hallucination None reported or observed  Judgment Limited  Confusion None  Danger to Self  Current suicidal ideation? Denies  Danger to Others  Danger to Others None reported or observed

## 2021-11-08 NOTE — Congregational Nurse Program (Signed)
Referral received by Haywood Lasso [facility] Marla Roe) to assist with coordinating care to a PCP for patient who meets Care Connect requirements>  Plan - Referral Coordinator Haywood Lasso) was f/u  and provided an appointment date to provide to while in facility  -Care Connect Uninsured Program Enrollment Appt set for Rica Mote) Jan 31 at 1:30pm

## 2021-11-08 NOTE — Progress Notes (Addendum)
[employer] MD Progress Note  11/08/2021 11:36 AM Mark Grant  MRN: [id number]  CC: SI and HI   Subjective: Mark Grant is a 36 y.o. male with no past psychiatric history who was put under involuntary commitment at the [facility] emergency department for suicidal and homicidal statements. He was admitted to the behavioral health hospital currently under Involuntary commitment. BHH Day 3   The patient's chart was reviewed and nursing notes were reviewed. Over the past 24 hrs, there were no documented behavioral issues, no PRN medications given for agitation, and the patient was compliant with scheduled medications. The patient's case was discussed in multidisciplinary team meeting.   On interview and assessment this morning, the patient exhibits a linear and logical thought process with a somewhat depressed affect. He reports that he is enjoying the milieu and has become sociable with other patients and is isolating less. He reports resolution of his homicidal thoughts towards his ex-girlfriend. He reports feeling positively about the Seroquel and is amenable to a dose increase. He denies auditory/visual hallucinations and first rank symptoms. He reports good mood, appetite, and sleep. He denies suicidal thoughts. The patient denies side effects from his medications.  Review of systems as below.  Diagnosis: Principal Problem:   Bipolar disorder, unspecified (HCC) Active Problems:   Cannabis abuse  Total Time spent with patient:  I personally spent 30 minutes on the unit in direct patient care. The direct patient care time included face-to-face time with the patient, reviewing the patient's chart, communicating with other professionals, and coordinating care. Greater than 50% of this time was spent in counseling or coordinating care with the patient regarding goals of hospitalization, psycho-education, and discharge planning needs.   Past Psychiatric History: See H&P  Past Medical History:   Past Medical History:  Diagnosis Date   Asthma    Thyroid disease     Past Surgical History:  Procedure Laterality Date   THYROIDECTOMY     Family History: see H&P  Family Psychiatric History: See H&P  Social History:  Social History   Substance and Sexual Activity  Alcohol Use Yes   Comment: occasional     Social History   Substance and Sexual Activity  Drug Use Yes   Types: Marijuana   Comment: daily use    Social History   Socioeconomic History   Marital status: Single    Spouse name: Not on file   Number of children: Not on file   Years of education: Not on file   Highest education level: Not on file  Occupational History   Not on file  Tobacco Use   Smoking status: Every Day    Types: Cigarettes   Smokeless tobacco: Former  Building services engineer Use: Some days   Substances: Nicotine  Substance and Sexual Activity   Alcohol use: Yes    Comment: occasional   Drug use: Yes    Types: Marijuana    Comment: daily use   Sexual activity: Not on file  Other Topics Concern   Not on file  Social History Narrative   Not on file   Social Determinants of Health   Financial Resource Strain: Not on file  Food Insecurity: Not on file  Transportation Needs: Not on file  Physical Activity: Not on file  Stress: Not on file  Social Connections: Not on file    Appetite: Fair  Current Medications: Current Facility-Administered Medications  Medication Dose Route Frequency Provider Last Rate Last Admin  acetaminophen (TYLENOL) tablet 650 mg  650 mg Oral Q6H PRN Bobbitt, Shalon E, NP       alum & mag hydroxide-simeth (MAALOX/MYLANTA) 200-200-20 MG/5ML suspension 30 mL  30 mL Oral Q4H PRN Bobbitt, Shalon E, NP       hydrOXYzine (ATARAX) tablet 25 mg  25 mg Oral TID PRN Bobbitt, Shalon E, NP   25 mg at 11/07/21 0742   levothyroxine (SYNTHROID) tablet 125 mcg  125 mcg Oral Q0600 Princess Bruins, DO   125 mcg at 11/08/21 5284   risperiDONE (RISPERDAL M-TABS)  disintegrating tablet 2 mg  2 mg Oral Q8H PRN Bobbitt, Shalon E, NP       And   LORazepam (ATIVAN) tablet 1 mg  1 mg Oral PRN Bobbitt, Shalon E, NP       And   ziprasidone (GEODON) injection 20 mg  20 mg Intramuscular PRN Bobbitt, Shalon E, NP       magnesium hydroxide (MILK OF MAGNESIA) suspension 30 mL  30 mL Oral Daily PRN Bobbitt, Shalon E, NP       nicotine polacrilex (NICORETTE) gum 2 mg  2 mg Oral PRN Bobbitt, Shalon E, NP   2 mg at 11/08/21 0734   QUEtiapine (SEROQUEL) tablet 200 mg  200 mg Oral QHS Princess Bruins, DO   200 mg at 11/07/21 2114   traZODone (DESYREL) tablet 50 mg  50 mg Oral QHS PRN Bobbitt, Shalon E, NP   50 mg at 11/07/21 2114    Lab Results:  Results for orders placed or performed during the hospital encounter of 11/05/21 (from the past 48 hour(s))  T3, free     Status: Abnormal   Collection Time: 11/06/21  6:34 PM  Result Value Ref Range   T3, Free 1.4 (L) 2.0 - 4.4 pg/mL    Comment: (NOTE) Performed At: [facility] [address] [place], [place] [id number] Jolene Schimke MD VO:[id number]   T4, free     Status: Abnormal   Collection Time: 11/06/21  6:34 PM  Result Value Ref Range   Free T4 0.35 (L) 0.61 - 1.12 ng/dL    Comment: (NOTE) Biotin ingestion may interfere with free T4 tests. If the results are inconsistent with the TSH level, previous test results, or the clinical presentation, then consider biotin interference. If needed, order repeat testing after stopping biotin. Performed at [facility], [address] [place], [place] [address]   TSH     Status: Abnormal   Collection Time: 11/06/21  6:34 PM  Result Value Ref Range   TSH 80.580 (H) 0.350 - 4.500 uIU/mL    Comment: Performed by a 3rd Generation assay with a functional sensitivity of <=0.01 uIU/mL. Performed at [facility], [address] [place], [place] [address]     Blood Alcohol level:  Lab Results  Component Value Date   ETH <10 11/04/2021     Metabolic Disorder Labs: No results found for: HGBA1C, MPG No results found for: PROLACTIN Lab Results  Component Value Date   CHOL 187 11/06/2021   TRIG 101 11/06/2021   HDL 52 11/06/2021   CHOLHDL 3.6 11/06/2021   VLDL 20 11/06/2021   LDLCALC 115 (H) 11/06/2021    Physical Findings: CIWA:  CIWA-Ar Total: 0  Musculoskeletal: Strength & Muscle Tone: within normal limits Gait & Station: normal Patient leans: N/A  Psychiatric Specialty Exam: Presentation  General Appearance: casually dressed, fair hygiene, long fingernails on his fifth digits  Eye Contact: Fair  Speech: slow, but clear  Speech Volume: normal  Mood and Affect  Mood: "good" - appears mildly dysphoric  Affect:  constricted  Thought Process  Thought Processes: Coherent; Goal Directed; Linear  Orientation: Full (Time, Place and Person)  Thought Content: Denies HI; no SI, AVH, paranoia, delusions, ideas of reference, or first rank symptoms; is not grossly responding to internal/external stimuli on exam  History of Schizophrenia/Schizoaffective disorder: No  Hallucinations: Hallucinations: None  Ideas of Reference: None  Suicidal Thoughts: denies Homicidal Thoughts: denies  Sensorium  Memory: Immediate Fair; Recent Fair; Remote Fair Judgment: Fair  Insight: Shallow  Executive Functions  Concentration: Fair Attention Span: Fair Recall: Fair Fund of Knowledge: Fair Language: Good  Psychomotor Activity  Psychomotor Activity:Normal Assets  Assets:Communication Skills; Desire for Improvement; Housing; Social Support  Sleep  Total time unrecorded  Physical Exam: Body mass index is 25.97 kg/m. Temp:  [97.4 F (36.3 C)-98.7 F (37.1 C)] 97.4 F (36.3 C) (01/23 0627) Pulse Rate:  [81-108] 92 (01/23 0630) BP: (129-141)/(89-102) 129/89 (01/23 0630) SpO2:  [98 %-100 %] 100 % (01/23 0630)  Physical Exam Vitals and nursing note reviewed.  Constitutional:      General: He is awake.  He is not in acute distress.    Appearance: He is not ill-appearing or diaphoretic.  HENT:     Head: Normocephalic.  Pulmonary:     Effort: Pulmonary effort is normal. No respiratory distress.  Neurological:     General: No focal deficit present.     Mental Status: He is alert and oriented to person, place, and time.  Psychiatric:        Behavior: Behavior is cooperative.    Review of Systems  Respiratory:  Negative for shortness of breath.   Cardiovascular:  Negative for chest pain.  Gastrointestinal:  Negative for nausea and vomiting.  Neurological:  Negative for dizziness and headaches.   Treatment Assessment & Plan Summary: Principal Problem:   Bipolar disorder, unspecified (HCC) Active Problems:   Cannabis abuse   Mark Grant is a 36 y.o. male with no past psychiatric history who was put under involuntary commitment at the [facility] emergency department for suicidal and homicidal statements. He was admitted to the behavioral health hospital currently under Involuntary commitment. BHH Day 3   PLAN: Unspecified bipolar and related d/o (r/o Bipolar II vs I; r/o SIMD, r/o mood d/o secondary to general medical illness - hypothyroidism; r/o IED) Alcohol use - r/o alcohol use d/o Cannabis use disorder Tobacco use disorder   Unspecified bipolar and related d/o (r/o Bipolar II vs I; r/o SIMD, r/o mood d/o secondary to general medical illness - hypothyroidism; r/o IED) -Increase Seroquel to 300 mg for mood stability -Pt educated on r/b/se to medication (TD/EPS/WG) and in agreement Antipsychotic labs             EKG: NSR, Qtc 439             Lipids: LDL of 115, otherwise unremarkable             A1C: 4.9, BMI of 26 AIMS: not yet assessed - Reassess for start of possible antidepressant once he has adequate mood stabilizer on board - Would benefit from psychotherapy after discharge   Cannabis use d/o Alcohol use - r/o alcohol use d/o Tobacco use d/o -Nicotine  gum -encourage abstinence -CIWA monitoring with Ativan 1mg  for score >10 (recent scores 0) - po thiamine and MVI replacement   Medical Management Covid negative CMP: unremarkable CBC:  unremarkable EtOH: <10 UDS: THC TSH: 120 > 80.58 Free T4 0.35 Free T3 1.4  Mg2+ WNL Lipids: LDL of 115, otherwise unremarkable EKG: NSR, Qtc of 439   Hypothyroidism s/p thyroidectomy  Patient reports self-reducing his synthroid dose from 125 to 50 mcg due to perceived side effect of constipation. Hypothyroidism may be contributing to his depression.  -Continued Synthroid at home dose of 125 mcg - Will need PCP f/u after discharge  Dispo: PCP: Hypothyroidism  Safety, monitoring and disposition planning: The patient was seen and evaluated on the unit.  The patient's chart was reviewed and nursing notes were reviewed.  The patient's case was discussed in multidisciplinary team meeting.  Plan and drug side effects were discussed with patient who was amendable. Social work and case management to assist with discharge planning and identification of hospital follow-up needs prior to discharge. Discharge Concerns: Need to establish a safety plan; Medication compliance and effectiveness Discharge Goals: Return home with outpatient referrals for mental health follow-up including medication management/psychotherapy Safety and Monitoring: Involuntary status at inpatient psychiatric unit for safety, stabilization and treatment Daily contact with patient to assess and evaluate symptoms and progress in treatment and medical management Patient's case to be discussed in multi-disciplinary team meeting Observation Level: Per chart Vital signs:  q12 hours Precautions: Per chart   Signed: Carlyn ReichertNick Gabrielle, MD PGY-1

## 2021-11-08 NOTE — Group Note (Signed)
Recreation Therapy Group Note   Group Topic:Stress Management  Group Date: 11/08/2021 Start Time: 0935 End Time: 0950 Facilitators: Caroll Rancher, [place] Location: [address] Dayroom   Goal Area(s) Addresses:  Patient will identify positive stress management techniques. Patient will identify benefits of using stress management post d/c.  Group Description:  Meditation.  LRT played a meditation that focused on bringing positive energy into the day.  Patients were to listen, follow along and mentally repeat the affirmations that were being presented during the meditation.     Affect/Mood: Appropriate   Participation Level: Active   Participation Quality: Independent   Behavior: Appropriate   Speech/Thought Process: Focused   Insight: Good   Judgement: Good   Modes of Intervention: Meditation   Patient Response to Interventions:  Engaged   Education Outcome:  Acknowledges education and In group clarification offered    Clinical Observations/Individualized Feedback: Pt attended and participated in activity.    Plan: Continue to engage patient in RT group sessions 2-3x/week.   Caroll Rancher, LRT,CTRS 11/08/2021 11:52 AM

## 2021-11-09 NOTE — Progress Notes (Signed)
°   11/09/21 0500  °Sleep  °Number of Hours 6.5  ° ° °

## 2021-11-09 NOTE — BHH Group Notes (Signed)
Spiritual care group on grief and loss facilitated by chaplain Janne Napoleon, [facility]   Group Goal:   Support / Education around grief and loss   Members engage in facilitated group support and psycho-social education.   Group Description:   Following introductions and group rules, group members engaged in facilitated group dialog and support around topic of loss, with particular support around experiences of loss in their lives. Group Identified types of loss (relationships / self / things) and identified patterns, circumstances, and changes that precipitate losses. Reflected on thoughts / feelings around loss, normalized grief responses, and recognized variety in grief experience. Group noted Worden's four tasks of grief in discussion.   Group drew on Adlerian / Rogerian, narrative, MI,   Patient Progress: Mark Grant attended group and showed engagement, though verbal participation was limited.  [place], [place] Pager, [phone] 11:21 AM

## 2021-11-09 NOTE — Progress Notes (Signed)
[facility]  11/09/2021 2:43 PM Mark FlorJonathan K Grant  MRN: [id number]  CC: SI and HI   Subjective: Mark Grant is a 36 y.o. male with no past psychiatric history who was put under involuntary commitment at the [facility] emergency department for suicidal and homicidal statements. He was admitted to the behavioral health hospital currently under Involuntary commitment. BHH Day 4   The patient's chart was reviewed and nursing notes were reviewed. Over the past 24 hrs, there were no documented behavioral issues, no PRN medications given for agitation, and the patient was compliant with scheduled medications. The patient's case was discussed in multidisciplinary team meeting.   On interview and assessment this morning, the patient exhibits a linear and logical thought process with a somewhat depressed affect.  He reports being drowsy this morning, which she attributes to taking trazodone on top of his increased dose of Seroquel last night.  He reports that his mother 2 times since admission.  Admitting the first phone call was "tough".  He reports next phone call was more successful.  He reports fleeting homicidal thoughts against his ex-girlfriend and her group of friends.  He reports ruminating on "what are they saying about me".  He denies any plan or intent to do anything to harm them. The patient denies auditory/visual hallucinations and first rank symptoms.  The patient reports good mood, appetite, and sleep. They deny suicidal thoughts. The patient denies side effects from their medications except for the drowsiness listed above.  Review of systems as below.   Diagnosis: Principal Problem:   Bipolar disorder, unspecified (HCC) Active Problems:   Cannabis abuse  Total Time spent with patient:  I personally spent 30 minutes on the unit in direct patient care. The direct patient care time included face-to-face time with the patient, reviewing the patient's chart, communicating with  other professionals, and coordinating care. Greater than 50% of this time was spent in counseling or coordinating care with the patient regarding goals of hospitalization, psycho-education, and discharge planning needs.   Past Psychiatric History: See H&P  Past Medical History:  Past Medical History:  Diagnosis Date   Asthma    Thyroid disease     Past Surgical History:  Procedure Laterality Date   THYROIDECTOMY     Family History: see H&P  Family Psychiatric History: See H&P  Social History:  Social History   Substance and Sexual Activity  Alcohol Use Yes   Comment: occasional     Social History   Substance and Sexual Activity  Drug Use Yes   Types: Marijuana   Comment: daily use    Social History   Socioeconomic History   Marital status: Single    Spouse name: Not on file   Number of children: Not on file   Years of education: Not on file   Highest education level: Not on file  Occupational History   Not on file  Tobacco Use   Smoking status: Every Day    Types: Cigarettes   Smokeless tobacco: Former  Building services engineerVaping Use   Vaping Use: Some days   Substances: Nicotine  Substance and Sexual Activity   Alcohol use: Yes    Comment: occasional   Drug use: Yes    Types: Marijuana    Comment: daily use   Sexual activity: Not on file  Other Topics Concern   Not on file  Social History Narrative   Not on file   Social Determinants of Health   Financial Resource  Strain: Not on file  Food Insecurity: Not on file  Transportation Needs: Not on file  Physical Activity: Not on file  Stress: Not on file  Social Connections: Not on file    Appetite: Fair  Current Medications: Current Facility-Administered Medications  Medication Dose Route Frequency Provider Last Rate Last Admin   acetaminophen (TYLENOL) tablet 650 mg  650 mg Oral Q6H PRN Bobbitt, Shalon E, NP       alum & mag hydroxide-simeth (MAALOX/MYLANTA) 200-200-20 MG/5ML suspension 30 mL  30 mL Oral Q4H PRN  Bobbitt, Shalon E, NP       hydrOXYzine (ATARAX) tablet 25 mg  25 mg Oral TID PRN Bobbitt, Shalon E, NP   25 mg at 11/07/21 0742   levothyroxine (SYNTHROID) tablet 125 mcg  125 mcg Oral Q0600 Princess Bruins, DO   125 mcg at 11/09/21 1610   risperiDONE (RISPERDAL M-TABS) disintegrating tablet 2 mg  2 mg Oral Q8H PRN Bobbitt, Shalon E, NP       And   LORazepam (ATIVAN) tablet 1 mg  1 mg Oral PRN Bobbitt, Shalon E, NP       And   ziprasidone (GEODON) injection 20 mg  20 mg Intramuscular PRN Bobbitt, Shalon E, NP       magnesium hydroxide (MILK OF MAGNESIA) suspension 30 mL  30 mL Oral Daily PRN Bobbitt, Shalon E, NP       nicotine polacrilex (NICORETTE) gum 2 mg  2 mg Oral PRN Bobbitt, Shalon E, NP   2 mg at 11/09/21 1249   QUEtiapine (SEROQUEL) tablet 300 mg  300 mg Oral QHS Carlyn Reichert, MD   300 mg at 11/08/21 2130   traZODone (DESYREL) tablet 50 mg  50 mg Oral QHS PRN Bobbitt, Shalon E, NP   50 mg at 11/08/21 2130    Lab Results:  No results found for this or any previous visit (from the past 48 hour(s)).   Blood Alcohol level:  Lab Results  Component Value Date   ETH <10 11/04/2021    Metabolic Disorder Labs: Lab Results  Component Value Date   HGBA1C 4.9 11/06/2021   MPG 94 11/06/2021   No results found for: PROLACTIN Lab Results  Component Value Date   CHOL 187 11/06/2021   TRIG 101 11/06/2021   HDL 52 11/06/2021   CHOLHDL 3.6 11/06/2021   VLDL 20 11/06/2021   LDLCALC 115 (H) 11/06/2021    Physical Findings: CIWA:  CIWA-Ar Total: 0  Musculoskeletal: Strength & Muscle Tone: within normal limits Gait & Station: normal Patient leans: N/A  Psychiatric Specialty Exam: Presentation  General Appearance: casually dressed, fair hygiene, long fingernails on his fifth digits  Eye Contact: Fair  Speech: slow, but clear  Speech Volume: normal  Mood and Affect  Mood: "good"  Affect:  somewhat depressed  Thought Process  Thought Processes: Coherent; Goal  Directed; Linear  Orientation: Full (Time, Place and Person)  Thought Content: passive and fleeting HI; no SI, AVH, paranoia, delusions, ideas of reference, or first rank symptoms; is not grossly responding to internal/external stimuli on exam  History of Schizophrenia/Schizoaffective disorder: No  Hallucinations:  none  Ideas of Reference: None  Suicidal Thoughts: denies Homicidal Thoughts: fleeting HI, no formed plan, no intent  Sensorium  Memory: Immediate Fair; Recent Fair; Remote Fair Judgment: Fair  Insight: Shallow  Executive Functions  Concentration: Fair Attention Span: Fair Recall: Fair Fund of Knowledge: Fair Language: Good  Psychomotor Activity  Psychomotor Activity:Normal Assets  Assets:Communication Skills; Desire  for Improvement; Housing; Social Support  Sleep  fair  Physical Exam: Body mass index is 25.97 kg/m. Temp:  [97.4 F (36.3 C)] 97.4 F (36.3 C) (01/24 0605) Pulse Rate:  [93-102] 100 (01/24 0607) BP: (116-142)/(84-96) 116/86 (01/24 0607) SpO2:  [100 %] 100 % (01/24 16100607)  Physical Exam Vitals and nursing note reviewed.  Constitutional:      General: He is awake. He is not in acute distress.    Appearance: He is not ill-appearing or diaphoretic.  HENT:     Head: Normocephalic.  Pulmonary:     Effort: Pulmonary effort is normal. No respiratory distress.  Neurological:     General: No focal deficit present.     Mental Status: He is alert and oriented to person, place, and time.  Psychiatric:        Behavior: Behavior is cooperative.    Review of Systems  Respiratory:  Negative for shortness of breath.   Cardiovascular:  Negative for chest pain.  Gastrointestinal:  Negative for nausea and vomiting.  Neurological:  Negative for dizziness and headaches.   Treatment Assessment & Plan Summary: Principal Problem:   Bipolar disorder, unspecified (HCC) Active Problems:   Cannabis abuse   Mark FlorJonathan K Levan is a 36 y.o. male with no  past psychiatric history who was put under involuntary commitment at the [facility] department for suicidal and homicidal statements. He was admitted to the behavioral health hospital currently under Involuntary commitment. BHH Day 4   PLAN: Unspecified bipolar and related d/o (r/o Bipolar II vs I; r/o SIMD, r/o mood d/o secondary to general medical illness - hypothyroidism; r/o IED) Alcohol use - r/o alcohol use d/o Cannabis use disorder Tobacco use disorder   Unspecified bipolar and related d/o (r/o Bipolar II vs I; r/o SIMD, r/o mood d/o secondary to general medical illness - hypothyroidism; r/o IED) -Continue Seroquel 300 mg for mood stability -Pt educated on r/b/se to medication (TD/EPS/WG) and in agreement Antipsychotic labs             EKG: NSR, Qtc 439             Lipids: LDL of 115, otherwise unremarkable             A1C: 4.9, BMI of 26 AIMS: not yet assessed - Reassess for start of possible antidepressant once he has adequate mood stabilizer on board - Would benefit from psychotherapy after discharge -Discontinue Trazodone given drowsiness   Cannabis use d/o Alcohol use - r/o alcohol use d/o Tobacco use d/o -Nicotine gum -encourage abstinence -CIWA monitoring with Ativan 1mg  for score >10 (recent scores 0) - po thiamine and MVI replacement   Medical Management Covid negative CMP: unremarkable CBC: unremarkable EtOH: <10 UDS: THC TSH: 120 > 80.58 Free T4 0.35 Free T3 1.4  Mg2+ WNL Lipids: LDL of 115, otherwise unremarkable EKG: NSR, Qtc of 439   Hypothyroidism s/p thyroidectomy  Patient reports self-reducing his synthroid dose from 125 to 50 mcg due to perceived side effect of constipation. Hypothyroidism may be contributing to his depression.  -Continued Synthroid at home dose of 125 mcg - Will need PCP f/u after discharge  Dispo: PCP: referral made  Safety, monitoring and disposition planning: The patient was seen and evaluated on the unit.   The patient's chart was reviewed and nursing notes were reviewed.  The patient's case was discussed in multidisciplinary team meeting.  Plan and drug side effects were discussed with patient who was amendable. Social work and case  management to assist with discharge planning and identification of hospital follow-up needs prior to discharge. Discharge Concerns: Need to establish a safety plan; Medication compliance and effectiveness Discharge Goals: Return home with outpatient referrals for mental health follow-up including medication management/psychotherapy Safety and Monitoring: Involuntary status at inpatient psychiatric unit for safety, stabilization and treatment Daily contact with patient to assess and evaluate symptoms and progress in treatment and medical management Patient's case to be discussed in multi-disciplinary team meeting Observation Level: Per chart Vital signs:  q12 hours Precautions: Per chart   Signed: Carlyn Reichert, MD PGY-1

## 2021-11-09 NOTE — Progress Notes (Signed)
Psychoeducational Group Note  Date:  11/09/2021 Time:  2241  Group Topic/Focus:  Wrap-Up Group:   The focus of this group is to help patients review their daily goal of treatment and discuss progress on daily workbooks.  Participation Level: Did Not Attend  Participation Quality:  Not Applicable  Affect:  Not Applicable  Cognitive:  Not Applicable  Insight:  Not Applicable  Engagement in Group: Not Applicable  Additional Comments:  The patient did not attend group this evening.   Hazle Coca S 11/09/2021, 10:41 PM

## 2021-11-09 NOTE — Group Note (Signed)
Recreation Therapy Group Note   Group Topic:Animal Assisted Therapy   Group Date: 11/09/2021 Start Time: 1430 End Time: 1515 Facilitators: Caroll Rancher, LRT,CTRS Location: [address]   AAA/T Program Assumption of Risk Form signed by Patient/ or Parent Legal Guardian YES  Patient is free of allergies or severe asthma  YES  Patient reports no fear of animals YES  Patient reports no history of cruelty to animals YES  Patient understands their participation is voluntary YES  Patient washes hands before animal contact YES  Patient washes hands after animal contact YES   Group Description: Patients provided opportunity to interact with trained and credentialed [employer] Therapy dog and the community volunteer/dog handler. Patients practiced appropriate animal interaction and were educated on dog safety outside of the hospital in common community settings. Patients were allowed to use dog toys and other items to practice commands, engage the dog in play, and/or complete routine aspects of animal care.    Affect/Mood: Appropriate   Participation Level: Moderate    Clinical Observations/Individualized Feedback: Pt was appropriate and interacted appropriately with the therapy dog team.  Pt asked questions and shared stories personal stories of their pets at home.      Plan: Continue to engage patient in RT group sessions 2-3x/week.   Caroll Rancher, Antonietta Jewel  11/09/2021 3:49 PM

## 2021-11-09 NOTE — Group Note (Signed)
Date:  11/09/2021 Time:  9:55 AM  Group Topic/Focus:  Goals Group:   The focus of this group is to help patients establish daily goals to achieve during treatment and discuss how the patient can incorporate goal setting into their daily lives to aide in recovery.  Modes of Intervention:  Activity  Additional Comments:  Pt is going to focus on  "not dwelling" today and "live for the moment."  Margaret Pyle 11/09/2021, 9:55 AM

## 2021-11-09 NOTE — Progress Notes (Signed)
Progress note  Pt found in bed. Pt allowed to rest with no morning medications ordered. 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 Pt had c/o withdrawal symptoms from nicotine. Pt provided nicotine supplementation. Pt has been seen in the dayroom with groups and interacting with peers. Pt denies si/hi/ah/vh and verbally agrees to approach staff if these become apparent or before harming themselves/others while at bhh.  A: Pt provided support and encouragement. Pt given medication per protocol and standing orders. Q82m safety checks implemented and continued.  R: Pt safe on the unit. Will continue to monitor.

## 2021-11-09 NOTE — Progress Notes (Signed)
Pt stated he was doing better, pt stated he feels better about things going forward    11/09/21 2200  Psych Admission Type (Psych Patients Only)  Admission Status Involuntary  Psychosocial Assessment  Patient Complaints Anxiety  Eye Contact Fair  Facial Expression Animated;Anxious  Affect Anxious;Appropriate to circumstance  Speech Logical/coherent  Interaction Assertive  Motor Activity Slow  Appearance/Hygiene Improved  Behavior Characteristics Cooperative  Mood Anxious;Pleasant  Thought Process  Coherency WDL  Content WDL  Delusions WDL  Perception WDL  Hallucination None reported or observed  Judgment Poor  Confusion None  Danger to Self  Current suicidal ideation? Denies  Danger to Others  Danger to Others None reported or observed

## 2021-11-09 NOTE — Plan of Care (Signed)
°  Problem: Self-Concept: Goal: Level of anxiety will decrease Outcome: Progressing   Problem: Health Behavior/Discharge Planning: Goal: Identification of resources available to assist in meeting health care needs will improve Outcome: Progressing   Problem: Medication: Goal: Compliance with prescribed medication regimen will improve Outcome: Progressing

## 2021-11-10 DIAGNOSIS — F1210 Cannabis abuse, uncomplicated: Secondary | ICD-10-CM

## 2021-11-10 DIAGNOSIS — F3181 Bipolar II disorder: Secondary | ICD-10-CM

## 2021-11-10 MED ORDER — NICOTINE POLACRILEX 2 MG MT GUM: 2.0000 mg | CHEWING_GUM | OROMUCOSAL | 0 refills | Status: AC | PRN

## 2021-11-10 MED ORDER — QUETIAPINE FUMARATE 300 MG PO TABS: 300.0000 mg | ORAL_TABLET | Freq: Every day | ORAL | 0 refills | Status: AC

## 2021-11-10 NOTE — BHH Suicide Risk Assessment (Addendum)
[facility] Discharge Suicide Risk Assessment   Principal Problem: Bipolar disorder, unspecified ([place]) Discharge Diagnoses: Principal Problem:   Bipolar disorder, unspecified ([place]) Active Problems:   Cannabis abuse  During the patient's hospitalization, patient had extensive initial psychiatric evaluation, and follow-up psychiatric evaluations every day.  Psychiatric diagnoses provided upon initial assessment:  NA  Upon further evaluation the diagnoses were given as follows:  Bipolar affective disorder unspecified type, current episode depressed Cannabis and tobacco use disorders  Patient's psychiatric medications were adjusted on admission:  Start Seroquel 100 mg  During the hospitalization, other adjustments were made to the patient's psychiatric medication regimen:  Increase Seroquel to 300 mg nightly  Gradually, patient started adjusting to milieu.   Patient's care was discussed during the interdisciplinary team meeting every day during the hospitalization.  The patient denied having side effects to prescribed psychiatric medication.  The patient reports their target psychiatric symptoms of depression and anxiety responded well to the psychiatric medications, and the patient reports overall benefit other psychiatric hospitalization. Supportive psychotherapy was provided to the patient. The patient also participated in regular group therapy while admitted.   Labs were reviewed with the patient, and abnormal results were discussed with the patient. Specifically the patient, had a TSH of 120 on admission with low T3/T4 of 0.35/1.4. 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 The patient was restarted on Synthroid 125 mcg, which the patient reports he had not been taking (he reduced his dose to 50 mcg on his own because of perceived side effects). He was recommended for PCP follow up with appointments in place on discharge.   The patient denied having suicidal thoughts more  than 72 hours prior to discharge.  Patient denies having homicidal thoughts.  Patient denies having auditory hallucinations.  Patient denies any visual hallucinations.  Patient denies having paranoid thoughts.  The patient is able to verbalize their individual safety plan to this provider.  It is recommended to the patient to continue psychiatric medications as prescribed, after discharge from the hospital.    It is recommended to the patient to follow up with your outpatient psychiatric provider and PCP.  Discussed with the patient, the impact of alcohol, drugs, tobacco have been there overall psychiatric and medical wellbeing, and total abstinence from substance use was recommended the patient.   Physical Findings: CIWA:  CIWA-Ar Total: 0   Musculoskeletal: Strength & Muscle Tone: within normal limits Gait & Station: normal Patient leans: N/A   Psychiatric Specialty Exam: Presentation  General Appearance: casually dressed, fair hygiene, long fingernails on his fifth digits   Eye Contact: Fair   Speech: slow, but clear   Speech Volume: normal   Mood and Affect  Mood: "good"   Affect:  somewhat depressed, improved, more euthymic   Thought Process  Thought Processes: Coherent; Goal Directed; Linear   Orientation: Full (Time, Place and Person)   Thought Content: denies HI, no SI, AVH, paranoia, delusions, ideas of reference, or first rank symptoms; is not grossly responding to internal/external stimuli on exam   History of Schizophrenia/Schizoaffective disorder: No   Hallucinations:  none   Ideas of Reference: None   Suicidal Thoughts: denies Homicidal Thoughts: denies HI   Sensorium  Memory: Immediate Fair; Recent Fair; Remote Fair Judgment: Fair   Insight: Shallow   Executive Functions  Concentration: Fair Attention Span: Fair Recall: [place] of Knowledge: Fair Language: Good   Psychomotor Activity  Psychomotor Activity:Normal Assets   Assets:Communication Skills; Desire for Improvement; Housing;  Social Support   Sleep  fair   Physical Exam: BP (!) 114/98 (BP Location: Right Arm)    Pulse 96    Temp (!) 97.5 F (36.4 C) (Oral)    Resp 16    Ht 5\' 10"  (1.778 m)    Wt 82.1 kg    SpO2 100%    BMI 25.97 kg/m    Physical Exam Vitals and nursing note reviewed.  Constitutional:      General: He is awake. He is not in acute distress.    Appearance: He is not ill-appearing or diaphoretic.  HENT:     Head: Normocephalic.  Pulmonary:     Effort: Pulmonary effort is normal. No respiratory distress.  Neurological:     General: No focal deficit present.     Mental Status: He is alert and oriented to person, place, and time.  Psychiatric:        Behavior: Behavior is cooperative.    Review of Systems  Respiratory:  Negative for shortness of breath.   Cardiovascular:  Negative for chest pain.  Gastrointestinal:  Negative for nausea and vomiting.  Neurological:  Negative for dizziness and headaches.    Mental Status Per Nursing Assessment::   On Admission:  NA  Demographic Factors:  Male, Caucasian, and Unemployed  Loss Factors: NA  Historical Factors: NA  Risk Reduction Factors:   Living with another person, especially a relative, Positive social support, and Positive therapeutic relationship  Continued Clinical Symptoms:  Depression:   Anhedonia Insomnia Alcohol/Substance Abuse/Dependencies More than one psychiatric diagnosis  Cognitive Features That Contribute To Risk:  None    Suicide Risk:  Minimal: No identifiable suicidal ideation currently.  Patients presenting with few risk factors, but several protective factors such as good social support.   Follow-up Information     Services, [facility]. Go on 11/15/2021.   Why: You have a hospital follow up appointment for therapy and medication management services on 11/15/21 at 12:00 pm. This appointment will be held in person. Contact  information: [address] [place] [address] [phone]         [facility] Follow up.   Why: A referral has been made through [facility] to receive primary care services from this provider. Contact information: [address]. Go on 11/16/2021.   Why: You have a financial assistance appointment on 11/16/21 at 1:30 pm to obtain primary care services..  This appointment will be held in person.  Please bring your photo ID, tax and income information, bank statement and recent pay stubs.  If not working, bring a letter of support from support person.  Proof of residence (medical bill to current address, etc.) Contact information: [place] [address] [place], [place] [address]  P: [phone]                Plan Of Care/Follow-up recommendations:  Activity as tolerated. Diet as recommended by PCP. Keep all scheduled follow-up appointments as recommended.  Patient is instructed to take all prescribed medications as recommended. Report any side effects or adverse reactions to your outpatient psychiatrist. Patient is instructed to abstain from alcohol and illegal drugs while on prescription medications. In the event of worsening symptoms, patient is instructed to call the crisis hotline, 911, or go to the nearest emergency department for evaluation and treatment.  Prescriptions given at discharge.  Patient agreeable to plan. Given opportunity to ask questions. Appears to feel comfortable with discharge.  Patient is also instructed prior to discharge to: Take all medications as prescribed by mental healthcare provider. Report any adverse effects and or reactions from the medicines to outpatient provider promptly. Patient has been instructed & cautioned: To not engage in alcohol and or illegal drug use while on prescription medicines. In the event  of worsening symptoms,  patient is instructed to call the crisis hotline, 911 and or go to the nearest ED for appropriate evaluation and treatment of symptoms. To follow-up with primary care provider for other medical issues, concerns and or health care needs  The patient was evaluated each day by a clinical provider to ascertain response to treatment. Improvement was noted by the patient's report of decreasing symptoms, improved sleep and appetite, affect, medication tolerance, behavior, and participation in unit programming.  Patient was asked each day to complete a self inventory noting mood, mental status, pain, new symptoms, anxiety and concerns.  Patient responded well to medication and being in a therapeutic and supportive environment. Positive and appropriate behavior was noted and the patient was motivated for recovery. The patient worked closely with the treatment team and case manager to develop a discharge plan with appropriate goals. Coping skills, problem solving as well as relaxation therapies were also part of the unit programming.  By the day of discharge patient was in much improved condition than upon admission.  Symptoms were reported as significantly decreased or resolved completely. The patient was motivated to continue taking medication with a goal of continued improvement in mental health.    Corky Sox, MD PGY-1

## 2021-11-10 NOTE — Plan of Care (Signed)
Discharge note  Patient verbalizes readiness for discharge. Follow up plan explained, AVS, Transition record and SRA given. Prescriptions and teaching provided. Belongings returned and signed for. Suicide safety plan completed and signed. Patient verbalizes understanding. Patient denies SI/HI and assures this Clinical research associate they will seek assistance should that change. Patient discharged to lobby to wait for mother.  Problem: Education: Goal: Knowledge of [facility] Education information/materials will improve Outcome: Adequate for Discharge Goal: Emotional status will improve Outcome: Adequate for Discharge Goal: Mental status will improve Outcome: Adequate for Discharge Goal: Verbalization of understanding the information provided will improve Outcome: Adequate for Discharge   Problem: Activity: Goal: Interest or engagement in activities will improve Outcome: Adequate for Discharge Goal: Sleeping patterns will improve Outcome: Adequate for Discharge   Problem: Coping: Goal: Ability to verbalize frustrations and anger appropriately will improve Outcome: Adequate for Discharge Goal: Ability to demonstrate self-control will improve Outcome: Adequate for Discharge   Problem: Health Behavior/Discharge Planning: Goal: Identification of resources available to assist in meeting health care needs will improve Outcome: Adequate for Discharge Goal: Compliance with treatment plan for underlying cause of condition will improve Outcome: Adequate for Discharge   Problem: Physical Regulation: Goal: Ability to maintain clinical measurements within normal limits will improve Outcome: Adequate for Discharge   Problem: Safety: Goal: Periods of time without injury will increase Outcome: Adequate for Discharge   Problem: Education: Goal: Utilization of techniques to improve thought processes will improve Outcome: Adequate for Discharge Goal: Knowledge of the prescribed therapeutic regimen  will improve Outcome: Adequate for Discharge   Problem: Activity: Goal: Interest or engagement in leisure activities will improve 11/10/2021 1149 by Raylene Miyamoto, RN Outcome: Adequate for Discharge 11/10/2021 9798 by Raylene Miyamoto, RN Outcome: Progressing Goal: Imbalance in normal sleep/wake cycle will improve 11/10/2021 1149 by Raylene Miyamoto, RN Outcome: Adequate for Discharge 11/10/2021 9211 by Raylene Miyamoto, RN Outcome: Progressing   Problem: Coping: Goal: Coping ability will improve 11/10/2021 1149 by Raylene Miyamoto, RN Outcome: Adequate for Discharge 11/10/2021 9417 by Raylene Miyamoto, RN Outcome: Progressing Goal: Will verbalize feelings Outcome: Adequate for Discharge   Problem: Health Behavior/Discharge Planning: Goal: Ability to make decisions will improve Outcome: Adequate for Discharge Goal: Compliance with therapeutic regimen will improve Outcome: Adequate for Discharge   Problem: Role Relationship: Goal: Will demonstrate positive changes in social behaviors and relationships Outcome: Adequate for Discharge   Problem: Safety: Goal: Ability to disclose and discuss suicidal ideas will improve Outcome: Adequate for Discharge Goal: Ability to identify and utilize support systems that promote safety will improve Outcome: Adequate for Discharge   Problem: Self-Concept: Goal: Will verbalize positive feelings about self Outcome: Adequate for Discharge Goal: Level of anxiety will decrease Outcome: Adequate for Discharge   Problem: Education: Goal: Ability to make informed decisions regarding treatment will improve Outcome: Adequate for Discharge   Problem: Coping: Goal: Coping ability will improve Outcome: Adequate for Discharge   Problem: Health Behavior/Discharge Planning: Goal: Identification of resources available to assist in meeting health care needs will improve Outcome: Adequate for Discharge   Problem: Medication: Goal:  Compliance with prescribed medication regimen will improve Outcome: Adequate for Discharge   Problem: Self-Concept: Goal: Ability to disclose and discuss suicidal ideas will improve Outcome: Adequate for Discharge Goal: Will verbalize positive feelings about self Outcome: Adequate for Discharge

## 2021-11-10 NOTE — Progress Notes (Signed)
°  [facility] Adult Case Management Discharge Plan :  Will you be returning to the same living situation after discharge:  Yes,  Home  At discharge, do you have transportation home?: Yes,  Sister  Do you have the ability to pay for your medications: Yes,  Family   Release of information consent forms completed and in the chart;  Patient's signature needed at discharge.  Patient to Follow up at:  Follow-up Information     [facility]. Go on 11/15/2021.   Why: You have a hospital follow up appointment for therapy and medication management services on 11/15/21 at 12:00 pm. This appointment will be held in person. Contact information: [address] [place] [address] [phone]         [facility] Follow up.   Why: A referral has been made through [facility] to receive primary care services from this provider. Contact information: [address] [place] [address] [phone]        [facility]. Go on 11/16/2021.   Why: You have a financial assistance appointment on 11/16/21 at 1:30 pm to obtain primary care services..  This appointment will be held in person.  Please bring your photo ID, tax and income information, bank statement and recent pay stubs.  If not working, bring a letter of support from support person.  Proof of residence (medical bill to current address, etc.) Contact information: [employer] [facility] [address] [place], [place] [address]  P: [phone]                Next level of care provider has access to [facility]:yes  Safety Planning and Suicide Prevention discussed: Yes,  with patient and mother      Has patient been referred to the Quitline?: Patient refused referral  Patient has been referred for addiction treatment: N/A  Aram Beecham, LCSWA 11/10/2021, 10:30 AM
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
## 2021-11-10 NOTE — Progress Notes (Signed)
°   11/10/21 0500  Sleep  Number of Hours 7.75

## 2021-11-10 NOTE — [facility] Counselor (Addendum)
CSW provided the Pt with a packet to complete before going to his scheduled appointment at [facility].  CSW explained the packet to the Pt and also provided additional information in the Pt's follow-up discharge instructions.

## 2021-11-10 NOTE — Plan of Care (Signed)
°  Problem: Activity: Goal: Interest or engagement in leisure activities will improve Outcome: Progressing Goal: Imbalance in normal sleep/wake cycle will improve Outcome: Progressing   Problem: Coping: Goal: Coping ability will improve Outcome: Progressing   

## 2021-11-13 NOTE — Group Note (Signed)
Late Entry °Music Therapy °Patient attended and participated. °

## 2021-11-18 ENCOUNTER — Ambulatory Visit: Payer: Self-pay | Admitting: Physician Assistant

## 2024-09-23 ENCOUNTER — Emergency Department (HOSPITAL_COMMUNITY)
Admission: EM | Admit: 2024-09-23 | Discharge: 2024-09-23 | Disposition: A | Discharge status: Home / Self Care | Payer: MEDICAID | Attending: Emergency Medicine | Admitting: Emergency Medicine

## 2024-09-23 ENCOUNTER — Other Ambulatory Visit: Payer: Self-pay

## 2024-09-23 ENCOUNTER — Encounter (HOSPITAL_COMMUNITY): Payer: Self-pay | Admitting: *Deleted

## 2024-09-23 DIAGNOSIS — I471 Supraventricular tachycardia, unspecified: Secondary | ICD-10-CM

## 2024-09-23 LAB — CBC WITH DIFFERENTIAL/PLATELET
Abs Immature Granulocytes: 0.02 K/uL (ref 0.00–0.07)
Basophils Absolute: 0 K/uL (ref 0.0–0.1)
Basophils Relative: 1 %
Eosinophils Absolute: 0.1 K/uL (ref 0.0–0.5)
Eosinophils Relative: 2 %
HCT: 39.2 % (ref 39.0–52.0)
Hemoglobin: 13.5 g/dL (ref 13.0–17.0)
Immature Granulocytes: 0 %
Lymphocytes Relative: 36 %
Lymphs Abs: 2.2 K/uL (ref 0.7–4.0)
MCH: 34.3 pg — ABNORMAL HIGH (ref 26.0–34.0)
MCHC: 34.4 g/dL (ref 30.0–36.0)
MCV: 99.5 fL (ref 80.0–100.0)
Monocytes Absolute: 0.3 K/uL (ref 0.1–1.0)
Monocytes Relative: 5 %
Neutro Abs: 3.5 K/uL (ref 1.7–7.7)
Neutrophils Relative %: 56 %
Platelets: 177 K/uL (ref 150–400)
RBC: 3.94 MIL/uL — ABNORMAL LOW (ref 4.22–5.81)
RDW: 15.1 % (ref 11.5–15.5)
WBC: 6.2 K/uL (ref 4.0–10.5)
nRBC: 0 % (ref 0.0–0.2)

## 2024-09-23 LAB — BASIC METABOLIC PANEL WITH GFR
Anion gap: 13 (ref 5–15)
BUN: 16 mg/dL (ref 6–20)
CO2: 24 mmol/L (ref 22–32)
Calcium: 9.5 mg/dL (ref 8.9–10.3)
Chloride: 100 mmol/L (ref 98–111)
Creatinine, Ser: 1.22 mg/dL (ref 0.61–1.24)
GFR, Estimated: >60 mL/min (ref >60)
Glucose, Bld: 152 mg/dL — ABNORMAL HIGH (ref 70–99)
Potassium: 3.3 mmol/L — ABNORMAL LOW (ref 3.5–5.1)
Sodium: 138 mmol/L (ref 135–145)

## 2024-09-23 LAB — TSH: TSH: 177 u[IU]/mL — ABNORMAL HIGH (ref 0.350–4.500)

## 2024-09-23 NOTE — ED Notes (Signed)
 ED Provider at bedside.

## 2024-09-23 NOTE — Discharge Instructions (Signed)
 Follow-up with cardiology.  The contact information for Dr. Nishan has been provided in this discharge summary for you to call and make these arrangements.  If symptoms recur attempt vagal maneuvers including Valsalva or ice packs to the face as we discussed this morning.  Return to the ER if symptoms recur and are not relieved with vagal maneuvers.

## 2024-09-23 NOTE — ED Provider Notes (Signed)
 [facility] EMERGENCY DEPARTMENT AT [facility] Provider Note   CSN: [id number] Arrival date & time: 09/23/24  0434     Patient presents with: Palpitations   Mark Grant is a 38 y.o. male.   Patient is a 38 year old male with history of hypothyroidism, asthma, bipolar.  Patient presenting today for evaluation of palpitations.  He woke from sleep with his heart racing.  He had difficulty calming down and felt somewhat tight in the chest and short of breath, so called 911.  Patient was found to be in SVT and given 6 mg of adenosine in the field with conversion to sinus rhythm.  Symptoms are now markedly improved.  Patient denies any chest pain or other complaints.  He reports being compliant with his thyroid medication.  He denies drug or alcohol use.       Prior to Admission medications   Medication Sig Start Date End Date Taking? Authorizing Provider  levothyroxine  (SYNTHROID ) 125 MCG tablet Take 1 tablet (125 mcg total) by mouth daily before breakfast. 04/11/19   Lissa Earnie RAMAN, MD  nicotine  polacrilex (NICORETTE ) 2 MG gum Take 1 each (2 mg total) by mouth as needed for smoking cessation. 11/10/21   Marry Clamp, MD  QUEtiapine  (SEROQUEL ) 300 MG tablet Take 1 tablet (300 mg total) by mouth at bedtime. 11/10/21   Marry Clamp, MD    Allergies: Patient has no known allergies.    Review of Systems  All other systems reviewed and are negative.   Updated Vital Signs BP (!) 144/112 (BP Location: Right Arm) Comment: Simultaneous filing. User may not have seen previous data.  Pulse (!) 105 Comment: Simultaneous filing. User may not have seen previous data.  Temp 98.4 F (36.9 C) (Oral)   Resp 20 Comment: Simultaneous filing. User may not have seen previous data.  Ht 5' 9 (1.753 m)   Wt 98 kg   SpO2 97% Comment: Simultaneous filing. User may not have seen previous data.  BMI 31.90 kg/m   Physical Exam Vitals and nursing note reviewed.  Constitutional:       General: He is not in acute distress.    Appearance: He is well-developed. He is not diaphoretic.  HENT:     Head: Normocephalic and atraumatic.  Cardiovascular:     Rate and Rhythm: Normal rate and regular rhythm.     Heart sounds: No murmur heard.    No friction rub.  Pulmonary:     Effort: Pulmonary effort is normal. No respiratory distress.     Breath sounds: Normal breath sounds. No wheezing or rales.  Abdominal:     General: Bowel sounds are normal. There is no distension.     Palpations: Abdomen is soft.     Tenderness: There is no abdominal tenderness.  Musculoskeletal:        General: Normal range of motion.     Cervical back: Normal range of motion and neck supple.  Skin:    General: Skin is warm and dry.  Neurological:     Mental Status: He is alert and oriented to person, place, and time.     Coordination: Coordination normal.     (all labs ordered are listed, but only abnormal results are displayed) Labs Reviewed  BASIC METABOLIC PANEL WITH GFR  CBC WITH DIFFERENTIAL/PLATELET  TSH    EKG: EKG Interpretation Date/Time:  Monday September 23 2024 04:44:20 EST Ventricular Rate:  106 PR Interval:    QRS Duration:  88 QT Interval:  345 QTC Calculation: 459 R Axis:   67  Text Interpretation: Sinus tachycardia Borderline T abnormalities, inferior leads Confirmed by Geroldine Berg ([id number]) on 09/23/2024 4:50:42 AM  Prehospital Rhythm Strips:       Radiology: No results found.   Procedures   Medications Ordered in the ED - No data to display                                  Medical Decision Making Amount and/or Complexity of Data Reviewed Labs: ordered.   Patient is a 38 year old male presenting with palpitations.  He was found by EMS to be in SVT with a rate well over 200.  He was successfully cardioverted in the field with adenosine.  He is now markedly improved upon presentation.  He arrives here with stable vital signs and is  afebrile.  Laboratory studies obtained including CBC and basic metabolic panel, both of which are unremarkable.  TSH is pending.  Patient has been observed and has had no further episodes of SVT.  I feel as though he can safely be discharged with cardiology follow-up.  The patient's mother is present at bedside and tells me that she has the same illness.  She apparently works for cardiology clinic in [place] and her cardiologist is Dr. Delford and she would like for her son to see him.  A referral will be placed and patient discharged to home.     Final diagnoses:  None    ED Discharge Orders     None          Geroldine Berg, MD 09/23/24 [phone]

## 2024-09-23 NOTE — ED Triage Notes (Signed)
 Pt BIB [facility] from home for c/o heart palpitations    Pt states he woke from sleep and felt his heart racing, when ems arrived pt's HR was in 220's, ems reports pt was pale  Pt given adenosine 6mg  IV by ems and HR decreased to 115's  Pt denies any pain
